# Patient Record
Sex: Female | Born: 1975 | Race: White | Hispanic: No | Smoking: Current every day smoker
Health system: Southern US, Community
[De-identification: ages and names within clinical notes are randomized; demographics above are authoritative.]

## PROBLEM LIST (undated history)

## (undated) ENCOUNTER — Emergency Department (HOSPITAL_COMMUNITY): Payer: Medicaid Other

## (undated) DIAGNOSIS — L988 Other specified disorders of the skin and subcutaneous tissue: Secondary | ICD-10-CM

## (undated) DIAGNOSIS — G8929 Other chronic pain: Secondary | ICD-10-CM

## (undated) DIAGNOSIS — L719 Rosacea, unspecified: Secondary | ICD-10-CM

## (undated) DIAGNOSIS — K5792 Diverticulitis of intestine, part unspecified, without perforation or abscess without bleeding: Secondary | ICD-10-CM

## (undated) DIAGNOSIS — G709 Myoneural disorder, unspecified: Secondary | ICD-10-CM

## (undated) DIAGNOSIS — F988 Other specified behavioral and emotional disorders with onset usually occurring in childhood and adolescence: Secondary | ICD-10-CM

## (undated) DIAGNOSIS — M549 Dorsalgia, unspecified: Secondary | ICD-10-CM

## (undated) DIAGNOSIS — F410 Panic disorder [episodic paroxysmal anxiety] without agoraphobia: Secondary | ICD-10-CM

## (undated) DIAGNOSIS — I10 Essential (primary) hypertension: Secondary | ICD-10-CM

## (undated) HISTORY — PX: COLON SURGERY: SHX602

## (undated) HISTORY — DX: Other specified behavioral and emotional disorders with onset usually occurring in childhood and adolescence: F98.8

## (undated) HISTORY — PX: WISDOM TOOTH EXTRACTION: SHX21

## (undated) HISTORY — DX: Rosacea, unspecified: L71.9

---

## 1999-08-29 ENCOUNTER — Other Ambulatory Visit: Admission: RE | Admit: 1999-08-29 | Discharge: 1999-08-29 | Payer: Self-pay | Admitting: Gynecology

## 2000-02-20 ENCOUNTER — Encounter: Payer: Self-pay | Admitting: Obstetrics and Gynecology

## 2000-02-20 ENCOUNTER — Ambulatory Visit (HOSPITAL_COMMUNITY): Admission: RE | Admit: 2000-02-20 | Discharge: 2000-02-20 | Payer: Self-pay | Admitting: Obstetrics and Gynecology

## 2000-05-10 ENCOUNTER — Ambulatory Visit (HOSPITAL_COMMUNITY): Admission: RE | Admit: 2000-05-10 | Discharge: 2000-05-10 | Payer: Self-pay | Admitting: Obstetrics and Gynecology

## 2000-07-20 ENCOUNTER — Inpatient Hospital Stay (HOSPITAL_COMMUNITY): Admission: AD | Admit: 2000-07-20 | Discharge: 2000-07-22 | Payer: Self-pay | Admitting: Obstetrics and Gynecology

## 2000-07-23 ENCOUNTER — Encounter: Admission: RE | Admit: 2000-07-23 | Discharge: 2000-08-25 | Payer: Self-pay | Admitting: Obstetrics and Gynecology

## 2000-08-30 ENCOUNTER — Other Ambulatory Visit: Admission: RE | Admit: 2000-08-30 | Discharge: 2000-08-30 | Payer: Self-pay | Admitting: Obstetrics and Gynecology

## 2001-09-08 ENCOUNTER — Other Ambulatory Visit: Admission: RE | Admit: 2001-09-08 | Discharge: 2001-09-08 | Payer: Self-pay | Admitting: Obstetrics and Gynecology

## 2002-10-23 ENCOUNTER — Other Ambulatory Visit: Admission: RE | Admit: 2002-10-23 | Discharge: 2002-10-23 | Payer: Self-pay | Admitting: Obstetrics and Gynecology

## 2003-03-13 ENCOUNTER — Encounter: Payer: Self-pay | Admitting: Obstetrics and Gynecology

## 2003-03-13 ENCOUNTER — Ambulatory Visit (HOSPITAL_COMMUNITY): Admission: RE | Admit: 2003-03-13 | Discharge: 2003-03-13 | Payer: Self-pay | Admitting: Obstetrics and Gynecology

## 2003-07-16 ENCOUNTER — Inpatient Hospital Stay (HOSPITAL_COMMUNITY): Admission: AD | Admit: 2003-07-16 | Discharge: 2003-07-16 | Payer: Self-pay | Admitting: Obstetrics and Gynecology

## 2003-07-17 ENCOUNTER — Inpatient Hospital Stay (HOSPITAL_COMMUNITY): Admission: AD | Admit: 2003-07-17 | Discharge: 2003-07-19 | Payer: Self-pay | Admitting: Obstetrics and Gynecology

## 2003-07-20 ENCOUNTER — Encounter: Admission: RE | Admit: 2003-07-20 | Discharge: 2003-08-19 | Payer: Self-pay | Admitting: Obstetrics and Gynecology

## 2003-08-28 ENCOUNTER — Other Ambulatory Visit: Admission: RE | Admit: 2003-08-28 | Discharge: 2003-08-28 | Payer: Self-pay | Admitting: Obstetrics and Gynecology

## 2004-10-02 ENCOUNTER — Other Ambulatory Visit: Admission: RE | Admit: 2004-10-02 | Discharge: 2004-10-02 | Payer: Self-pay | Admitting: Obstetrics and Gynecology

## 2005-10-08 ENCOUNTER — Other Ambulatory Visit: Admission: RE | Admit: 2005-10-08 | Discharge: 2005-10-08 | Payer: Self-pay | Admitting: Obstetrics and Gynecology

## 2010-08-06 ENCOUNTER — Emergency Department (HOSPITAL_BASED_OUTPATIENT_CLINIC_OR_DEPARTMENT_OTHER)
Admission: EM | Admit: 2010-08-06 | Discharge: 2010-08-06 | Disposition: A | Discharge status: Home / Self Care | Payer: BC Managed Care – PPO | Attending: Emergency Medicine | Admitting: Emergency Medicine

## 2010-08-06 DIAGNOSIS — G8929 Other chronic pain: Secondary | ICD-10-CM | POA: Insufficient documentation

## 2010-08-06 DIAGNOSIS — M549 Dorsalgia, unspecified: Secondary | ICD-10-CM | POA: Insufficient documentation

## 2010-08-06 DIAGNOSIS — I10 Essential (primary) hypertension: Secondary | ICD-10-CM | POA: Insufficient documentation

## 2010-11-14 NOTE — Discharge Summary (Signed)
[facility]  Patient:    Brandy Vaughn, Brandy Vaughn                MRN: [id number] Adm. Date:  [id number] Disc. Date: 07/22/00 Attending:  Oliver Pila                           Discharge Summary                                This is a 35 year old white female para 0, gravida 1, EDC July 24, 2000 by last period compatible with a first trimester ultrasound.  Presented to labor and delivery with contractions every seven to 10 minutes.  No leakage of fluid.  No vaginal bleeding.  Good fetal movement.  Patient had been dilated in the office on July 22, 2000 to 4 cm. In triage she was 5-6 cm.  Pregnancy was uncomplicated.  PRENATAL LABORATORIES:        B+ with a negative antibody.  RPR nonreactive. Rubella equivocal.  Hepatitis B surface antigen negative.  HIV negative.  GC and chlamydia negative.  Group B strep negative.  One hour glucola 143.  Three hour GTT:  Fasting 92, one hour 161, two hour 141, three hour 79.  PAST OBSTETRICAL HISTORY:     Negative.  PAST GYNECOLOGICAL HISTORY:   Negative.  PAST SURGICAL HISTORY:        Negative.  PAST MEDICAL HISTORY:         Negative.  ALLERGIES:                    No known drug allergies.  PHYSICAL EXAMINATION:  VITAL SIGNS:                  Afebrile with normal vital signs.  HEART:                        Normal.  LUNGS:                        Normal.  ABDOMEN:                      Soft.  Estimated fetal weight 8.5 pounds.  PELVIC:                       Cervix 5-6 cm, 90%.                                Dr. Senaida Ores performed artificial rupture of the membranes.  Patient had an epidural.  She progressed to full dilatation. She showed a double silver dollar of caput and wanted to proceed with forceps delivery.  Delivery was done by low forceps LOA over a midline episiotomy and third degree extension by Dr. Ambrose Mantle.  Infant was female 7 pounds 9 ounces, Apgars 9 at one and 9 at five  minutes.  Placenta intact.  Uterus normal. Rectal negative.  Midline episiotomy and third degree extension repaired with 3-0 Dexon on the episiotomy and 2-0 Vicryl on the sphincter.  Blood loss about 400 cc.  Postpartum the patient did well and was discharged on the second postpartum day.  A rubella titer was requested after delivery.  It has not  been done.  It will be prior to discharge for the patients future reference. No rubella vaccine is available in the hospital now.  LABORATORIES:                 Hemoglobin 13.2, hematocrit 37.7, white count 12,500, platelet count 242,000.  Followup hemoglobin 11.8, hematocrit 34.0, white count 12,300, platelet count 206,000.  RPR was nonreactive.  FINAL DIAGNOSES:              Intrauterine pregnancy at 39+ weeks, delivered left occiput anterior.  OPERATION:                    Low forceps delivery left occiput anterior, midline episiotomy, third degree extension and repair.  CONDITION ON DISCHARGE:       Improved.  INSTRUCTIONS:                 Regular discharge instruction booklet.  DISCHARGE MEDICATIONS:        Percocet 5/325 16 tablets one q.4-6h. as needed for pain.  FOLLOW-UP:                    Is advised to return to the office in six weeks for followup examination. DD:  07/22/00 TD:  07/22/00 Job: [id number]

## 2010-11-14 NOTE — Discharge Summary (Signed)
NAME:  Brandy Vaughn, Brandy Vaughn                          ACCOUNT NO.:  [id number]   MEDICAL RECORD NO.:  [id number]                   PATIENT TYPE:  INP   LOCATION:  9134                                 FACILITY:  [facility]   PHYSICIAN:  Zenaida Niece, M.D.             DATE OF BIRTH:  04-09-1976   DATE OF ADMISSION:  07/17/2003  DATE OF DISCHARGE:  07/19/2003                                 DISCHARGE SUMMARY   ADMISSION DIAGNOSES:  1. Intrauterine pregnancy at 36 weeks.  2. Preterm labor.   DISCHARGE DIAGNOSES:  1. Intrauterine pregnancy at 36 weeks.  2. Preterm labor.   PROCEDURES:  On July 17, 2003 she had a spontaneous vaginal delivery.   HISTORY AND PHYSICAL:  This is a 35 year old __________ female gravida 2  para 1-0-0-1 with an EGA of 36-plus weeks by a 10-week ultrasound who  presented with a complaint of regular contractions without bleeding or  rupture of membranes and with good fetal movement.  On evaluation in triage  over a couple hours she changed from 3-6 cm and was admitted.  Prenatal care  essentially uncomplicated.   PRENATAL LABORATORIES:  Blood type B positive with a negative antibody  screen, RPR nonreactive, rubella equivocal, hepatitis B surface antigen  negative, HIV negative, gonorrhea and Chlamydia negative, Pap smear was  normal, triple screen normal, 1-hour glucola 116 and group B streptococcus  was negative.   PAST OBSTETRICAL HISTORY:  January 2002 forceps delivery at 39 weeks, 7  pounds 9 ounces, complicated by PIH.  Remainder of her history is  noncontributory.   PHYSICAL EXAMINATION:  She was afebrile with stable vital signs.  Fetal  heart tracing reactive with contractions every 2-5 minutes.  Abdomen gravid,  nontender.  Vaginal exam on my first exam she was complete, complete, +3 and  no membranes were noted.   HOSPITAL COURSE:  Patient was admitted and brought to labor and delivery.  Upon arrival to labor and delivery she was completely dilated.   She then  pushed well and had a vaginal delivery of a viable female infant with Apgars  of 8 and 9 that weighed 5 pounds 5 ounces.  A loose nuchal cord x1 was  reduced.  Placenta delivered spontaneously and was intact.  She had first  degree perineal and periurethral lacerations which were hemostatic and not  repaired and estimated blood loss was less than 500 mL.  Postpartum she had  no complications.  Pre-delivery hemoglobin 13.3, post delivery 12.5.  On  postpartum day #2 she was stable for discharge home.   DISCHARGE INSTRUCTIONS:  Regular diet.  Pelvic rest.  Follow up in 6 weeks.  Medications are Percocet (#20) one p.o. q.6h. p.r.n. and over-the-counter  ibuprofen as needed.  She is given our discharge pamphlet.  Zenaida Niece, M.D.   TDM/MEDQ  D:  07/19/2003  T:  07/20/2003  Job:  [id number]

## 2010-12-14 ENCOUNTER — Inpatient Hospital Stay (HOSPITAL_COMMUNITY)
Admission: AD | Admit: 2010-12-14 | Discharge: 2010-12-15 | Disposition: A | Discharge status: Home / Self Care | Payer: BC Managed Care – PPO | Source: Ambulatory Visit | Attending: Obstetrics and Gynecology | Admitting: Obstetrics and Gynecology

## 2010-12-14 DIAGNOSIS — N949 Unspecified condition associated with female genital organs and menstrual cycle: Secondary | ICD-10-CM

## 2010-12-14 LAB — DIFFERENTIAL
Basophils Absolute: 0 10*3/uL (ref 0.0–0.1)
Basophils Relative: 0 % (ref 0–1)
Eosinophils Absolute: 0.1 10*3/uL (ref 0.0–0.7)
Eosinophils Relative: 1 % (ref 0–5)
Lymphocytes Relative: 23 % (ref 12–46)
Lymphs Abs: 2.4 10*3/uL (ref 0.7–4.0)
Monocytes Absolute: 0.8 10*3/uL (ref 0.1–1.0)
Monocytes Relative: 7 % (ref 3–12)
Neutro Abs: 7.4 10*3/uL (ref 1.7–7.7)
Neutrophils Relative %: 69 % (ref 43–77)

## 2010-12-14 LAB — URINALYSIS, ROUTINE W REFLEX MICROSCOPIC
Bilirubin Urine: NEGATIVE
Glucose, UA: NEGATIVE mg/dL
Ketones, ur: NEGATIVE mg/dL
Leukocytes, UA: NEGATIVE
Nitrite: NEGATIVE
Protein, ur: NEGATIVE mg/dL
Specific Gravity, Urine: <1.005 — ABNORMAL LOW (ref 1.005–1.030)
Urobilinogen, UA: 0.2 mg/dL (ref 0.0–1.0)
pH: 5 (ref 5.0–8.0)

## 2010-12-14 LAB — CBC
HCT: 36.2 % (ref 36.0–46.0)
MCHC: 32.9 g/dL (ref 30.0–36.0)
RDW: 13 % (ref 11.5–15.5)

## 2010-12-14 LAB — URINE MICROSCOPIC-ADD ON

## 2010-12-15 ENCOUNTER — Inpatient Hospital Stay (HOSPITAL_COMMUNITY): Payer: BC Managed Care – PPO

## 2010-12-15 LAB — WET PREP, GENITAL: Trich, Wet Prep: NONE SEEN

## 2010-12-16 LAB — GC/CHLAMYDIA PROBE AMP, GENITAL: Chlamydia, DNA Probe: NEGATIVE

## 2013-02-26 ENCOUNTER — Emergency Department (HOSPITAL_BASED_OUTPATIENT_CLINIC_OR_DEPARTMENT_OTHER)
Admission: EM | Admit: 2013-02-26 | Discharge: 2013-02-27 | Disposition: A | Discharge status: Home / Self Care | Payer: BC Managed Care – PPO | Attending: Emergency Medicine | Admitting: Emergency Medicine

## 2013-02-26 ENCOUNTER — Encounter (HOSPITAL_BASED_OUTPATIENT_CLINIC_OR_DEPARTMENT_OTHER): Payer: Self-pay | Admitting: *Deleted

## 2013-02-26 DIAGNOSIS — N898 Other specified noninflammatory disorders of vagina: Secondary | ICD-10-CM | POA: Insufficient documentation

## 2013-02-26 DIAGNOSIS — N72 Inflammatory disease of cervix uteri: Secondary | ICD-10-CM

## 2013-02-26 DIAGNOSIS — F17200 Nicotine dependence, unspecified, uncomplicated: Secondary | ICD-10-CM | POA: Insufficient documentation

## 2013-02-26 DIAGNOSIS — Z3202 Encounter for pregnancy test, result negative: Secondary | ICD-10-CM | POA: Insufficient documentation

## 2013-02-26 DIAGNOSIS — I10 Essential (primary) hypertension: Secondary | ICD-10-CM | POA: Insufficient documentation

## 2013-02-26 HISTORY — DX: Essential (primary) hypertension: I10

## 2013-02-26 NOTE — ED Notes (Signed)
Pt c/o left lower abd pain that started one week ago. States she has a hx of an IUD for past 2 years. Describes pain tender to touch to left lower abdomen. States she has had some yellow/brown discharge. Denies fevers. States a little urinary frequency at night.

## 2013-02-27 LAB — WET PREP, GENITAL
Trich, Wet Prep: NONE SEEN
Yeast Wet Prep HPF POC: NONE SEEN

## 2013-02-27 LAB — URINALYSIS, ROUTINE W REFLEX MICROSCOPIC
Bilirubin Urine: NEGATIVE
Nitrite: NEGATIVE
Specific Gravity, Urine: 1.013 (ref 1.005–1.030)
pH: 6 (ref 5.0–8.0)

## 2013-02-27 LAB — URINE MICROSCOPIC-ADD ON

## 2013-02-27 MED ORDER — CEFTRIAXONE SODIUM 250 MG IJ SOLR
250.0000 mg | Freq: Once | INTRAMUSCULAR | Status: AC
  Administered 2013-02-27: 250 mg via INTRAMUSCULAR
  Filled 2013-02-27: qty 250

## 2013-02-27 MED ORDER — HYDROCODONE-ACETAMINOPHEN 5-325 MG PO TABS: 1.0000 | ORAL_TABLET | Freq: Four times a day (QID) | ORAL | Status: DC | PRN

## 2013-02-27 MED ORDER — KETOROLAC TROMETHAMINE 60 MG/2ML IM SOLN
60.0000 mg | Freq: Once | INTRAMUSCULAR | Status: AC
  Administered 2013-02-27: 60 mg via INTRAMUSCULAR
  Filled 2013-02-27: qty 2

## 2013-02-27 MED ORDER — AZITHROMYCIN 1 G PO PACK
1.0000 g | PACK | Freq: Once | ORAL | Status: AC
  Administered 2013-02-27: 1 g via ORAL
  Filled 2013-02-27: qty 1

## 2013-02-27 MED ORDER — LIDOCAINE HCL (PF) 1 % IJ SOLN
INTRAMUSCULAR | Status: AC
  Filled 2013-02-27: qty 5

## 2013-02-27 MED ORDER — DOXYCYCLINE HYCLATE 100 MG PO CAPS: 100.0000 mg | ORAL_CAPSULE | Freq: Two times a day (BID) | ORAL | Status: DC

## 2013-02-27 NOTE — ED Notes (Signed)
MD at bedside. 

## 2013-02-27 NOTE — ED Provider Notes (Signed)
CSN: [id number]     Arrival date & time 02/26/13  2311 History  This chart was scribed for Brandy Smitty Cords, MD by Leone Payor, ED Scribe. This patient was seen in room MH01/MH01 and the patient's care was started 12:09 AM.    Chief Complaint  Patient presents with  . Abdominal Pain    Patient is a 37 y.o. female presenting with abdominal pain. The history is provided by the patient. No language interpreter was used.  Abdominal Pain Pain location:  Suprapubic Pain quality: aching   Pain radiates to:  Does not radiate Pain severity:  Moderate Onset quality:  Gradual Timing:  Constant Progression:  Unchanged Chronicity:  New Context: not alcohol use and not sick contacts   Relieved by:  Nothing Worsened by:  Nothing tried Ineffective treatments:  None tried Associated symptoms: vaginal discharge   Associated symptoms: no diarrhea   Vaginal discharge:    Quality:  Mucopurulent and yellow   Severity:  Severe   Onset quality:  Gradual   Timing:  Constant   Progression:  Worsening   Chronicity:  New Risk factors: no recent hospitalization     HPI Comments: Brandy Vaughn is a 37 y.o. female who presents to the Emergency Department complaining of constant, gradually worsening suprapubic pain that started about 1.5  weeks ago. States she began to have white discharge that began to turn yellow/brown and reports it to heavy. She has history of an IUD for the past 2 years, but she is unable to feel the strings. She is sexually active but with the same partner for over 1 year. She has had unprotected intercourse with this partner but he denies any sort of discharge. Last pap smear was over 1 year ago and normal. She denies dysuria, external genital lesions.    Past Medical History  Diagnosis Date  . Hypertension    History reviewed. No pertinent past surgical history. No family history on file. History  Substance Use Topics  . Smoking status: Current Every Day Smoker  .  Smokeless tobacco: Not on file  . Alcohol Use: Yes     Comment: occasional    OB History   Grav Para Term Preterm Abortions TAB SAB Ect Mult Living                 Review of Systems  Gastrointestinal: Positive for abdominal pain. Negative for diarrhea.  Genitourinary: Positive for vaginal discharge.  All other systems reviewed and are negative.    Allergies  Review of patient's allergies indicates no known allergies.  Home Medications   Current Outpatient Rx  Name  Route  Sig  Dispense  Refill  . Valsartan (DIOVAN PO)   Oral   Take by mouth.          BP 139/84  Pulse 91  Temp(Src) 98.6 F (37 C) (Oral)  Resp 20  SpO2 99% Physical Exam  Nursing note and vitals reviewed. Constitutional: She is oriented to person, place, and time. She appears well-developed and well-nourished. No distress.  HENT:  Head: Normocephalic and atraumatic.  Mouth/Throat: Oropharynx is clear and moist.  Eyes: Pupils are equal, round, and reactive to light.  Neck: Normal range of motion. Neck supple.  Cardiovascular: Normal rate, regular rhythm and normal heart sounds.   Pulmonary/Chest: Effort normal and breath sounds normal. No respiratory distress. She has no wheezes.  Abdominal: Soft. Bowel sounds are normal. There is tenderness (suprapubic). There is no rebound.  Genitourinary: Vaginal discharge found.  Chaperone present copious yellow mucopurulent discharge no seen strings for IUD  Musculoskeletal: Normal range of motion.  Neurological: She is alert and oriented to person, place, and time. She has normal reflexes.  Skin: Skin is warm and dry.  Psychiatric: She has a normal mood and affect.    ED Course  Procedures (including critical care time)  DIAGNOSTIC STUDIES: Oxygen Saturation is 99% on RA, normal by my interpretation.    COORDINATION OF CARE: 12:28 AM Discussed treatment plan with pt at bedside and pt agreed to plan.   Labs Review Labs Reviewed  URINALYSIS, ROUTINE W  REFLEX MICROSCOPIC  PREGNANCY, URINE   Imaging Review No results found.  MDM  No diagnosis found. Will treat for gc/chlamydia.  IUD must be removed by GYN as no strings visible.  Partner must be treated.  No sexual activity for 7 days after all partners treated.  Barrier protection for 1 month while taking antibiotics to prevent pregnancy.  Patient verbalizes understanding and agrees to follow up   I personally performed the services described in this documentation, which was scribed in my presence. The recorded information has been reviewed and is accurate.    Jasmine Awe, MD 02/27/13 [phone]

## 2013-02-28 ENCOUNTER — Ambulatory Visit (HOSPITAL_COMMUNITY)
Admission: RE | Admit: 2013-02-28 | Discharge: 2013-02-28 | Disposition: A | Discharge status: Home / Self Care | Payer: BC Managed Care – PPO | Source: Ambulatory Visit | Attending: Obstetrics and Gynecology | Admitting: Obstetrics and Gynecology

## 2013-02-28 ENCOUNTER — Other Ambulatory Visit (HOSPITAL_COMMUNITY): Payer: Self-pay | Admitting: Obstetrics and Gynecology

## 2013-02-28 DIAGNOSIS — Z30432 Encounter for removal of intrauterine contraceptive device: Secondary | ICD-10-CM

## 2013-02-28 DIAGNOSIS — Z30431 Encounter for routine checking of intrauterine contraceptive device: Secondary | ICD-10-CM | POA: Insufficient documentation

## 2013-02-28 DIAGNOSIS — N83209 Unspecified ovarian cyst, unspecified side: Secondary | ICD-10-CM | POA: Insufficient documentation

## 2013-02-28 LAB — URINE CULTURE

## 2013-03-27 ENCOUNTER — Other Ambulatory Visit: Payer: Self-pay | Admitting: Gastroenterology

## 2013-03-27 DIAGNOSIS — R109 Unspecified abdominal pain: Secondary | ICD-10-CM

## 2013-03-27 DIAGNOSIS — K639 Disease of intestine, unspecified: Secondary | ICD-10-CM

## 2013-03-29 ENCOUNTER — Other Ambulatory Visit: Payer: BC Managed Care – PPO

## 2013-04-03 ENCOUNTER — Ambulatory Visit
Admission: RE | Admit: 2013-04-03 | Discharge: 2013-04-03 | Disposition: A | Discharge status: Home / Self Care | Payer: BC Managed Care – PPO | Source: Ambulatory Visit | Attending: Gastroenterology | Admitting: Gastroenterology

## 2013-04-03 DIAGNOSIS — K639 Disease of intestine, unspecified: Secondary | ICD-10-CM

## 2013-04-03 DIAGNOSIS — R109 Unspecified abdominal pain: Secondary | ICD-10-CM

## 2013-04-03 MED ORDER — IOHEXOL 300 MG/ML  SOLN
100.0000 mL | Freq: Once | INTRAMUSCULAR | Status: AC | PRN
  Administered 2013-04-03: 100 mL via INTRAVENOUS

## 2013-07-11 ENCOUNTER — Other Ambulatory Visit: Payer: Self-pay | Admitting: *Deleted

## 2013-07-11 ENCOUNTER — Encounter (INDEPENDENT_AMBULATORY_CARE_PROVIDER_SITE_OTHER): Payer: Self-pay

## 2013-07-11 ENCOUNTER — Ambulatory Visit (INDEPENDENT_AMBULATORY_CARE_PROVIDER_SITE_OTHER): Payer: Medicaid Other | Admitting: Family Medicine

## 2013-07-11 ENCOUNTER — Encounter: Payer: Self-pay | Admitting: Family Medicine

## 2013-07-11 VITALS — BP 152/94 | HR 78 | Temp 98.7°F | Resp 16 | Ht 67.0 in | Wt 163.0 lb

## 2013-07-11 DIAGNOSIS — R05 Cough: Secondary | ICD-10-CM

## 2013-07-11 DIAGNOSIS — J111 Influenza due to unidentified influenza virus with other respiratory manifestations: Secondary | ICD-10-CM

## 2013-07-11 DIAGNOSIS — R059 Cough, unspecified: Secondary | ICD-10-CM

## 2013-07-11 DIAGNOSIS — J069 Acute upper respiratory infection, unspecified: Secondary | ICD-10-CM

## 2013-07-11 LAB — POCT INFLUENZA A/B
Influenza A, POC: NEGATIVE
Influenza B, POC: NEGATIVE

## 2013-07-11 MED ORDER — AZITHROMYCIN 500 MG PO TABS: 500.0000 mg | ORAL_TABLET | Freq: Every day | ORAL | Status: DC

## 2013-07-11 MED ORDER — AZITHROMYCIN 500 MG PO TABS: 500.0000 mg | ORAL_TABLET | Freq: Every day | ORAL | Status: AC

## 2013-07-11 MED ORDER — HYDROCODONE-HOMATROPINE 5-1.5 MG/5ML PO SYRP: 5.0000 mL | ORAL_SOLUTION | Freq: Four times a day (QID) | ORAL | Status: DC | PRN

## 2013-07-11 MED ORDER — BENZONATATE 200 MG PO CAPS: 200.0000 mg | ORAL_CAPSULE | Freq: Three times a day (TID) | ORAL | Status: DC | PRN

## 2013-07-11 MED ORDER — OSELTAMIVIR PHOSPHATE 75 MG PO CAPS: 75.0000 mg | ORAL_CAPSULE | Freq: Two times a day (BID) | ORAL | Status: DC

## 2013-07-11 MED ORDER — OSELTAMIVIR PHOSPHATE 75 MG PO CAPS
75.0000 mg | ORAL_CAPSULE | Freq: Two times a day (BID) | ORAL | Status: DC
Start: 2013-07-11 — End: 2013-07-11

## 2013-07-11 NOTE — Progress Notes (Signed)
Subjective:    Patient ID: Brandy Vaughn, female    DOB: 01/10/1976, 38 y.o.   MRN: [id number]   [name] is here complaining of URI symptoms.  She feels that she may have the flu.  She had dinner yesterday with a friend Brandy Vaughn(Cheryl Mabry) who was diagnosed with Influenza.   URI  The current episode started in the past 7 days. Associated symptoms include chest pain, coughing and rhinorrhea. Treatments tried: Nyquil, Delsym, Tylenol, Ibuprofen and Aleve  The treatment provided no relief.     Review of Systems  Constitutional: Positive for fatigue.  HENT: Positive for rhinorrhea.   Respiratory: Positive for cough.   Cardiovascular: Positive for chest pain.       With cough   Musculoskeletal: Positive for arthralgias and myalgias.    Past Medical History  Diagnosis Date  . Hypertension   . ADD (attention deficit disorder)   . Rosacea      Past Surgical History  Procedure Laterality Date  . Wisdom tooth extraction       History   Social History Narrative   Marital Status:  Separated (Richard)    Children:  Daughters (2)    Pets: Dog (Zoey)    Living Situation: Lives with children.    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    Alcohol Use:  Occasional   Drug Use:  None   Diet:  Regular   Exercise:  Limited    Hobbies:  Reading      Family History  Problem Relation Age of Onset  . Cancer Mother     Colon, Liver and Spine  . Hypertension Father   . Osteoporosis Paternal Grandmother   . Cancer Paternal Grandfather     Bladder Cancer     Current Outpatient Prescriptions on File Prior to Visit  Medication Sig Dispense Refill  . HYDROcodone-acetaminophen (NORCO) 5-325 MG per tablet Take 1 tablet by mouth every 6 (six) hours as needed for pain.  6 tablet  0   No current facility-administered medications on file prior to visit.     No Known Allergies      Objective:   Physical Exam  Nursing  note and vitals reviewed. Constitutional: She appears well-nourished. She is cooperative. She appears ill. No distress.  HENT:  Head: Normocephalic.  Mouth/Throat: No oropharyngeal exudate.  Eyes: Conjunctivae are normal. Right eye exhibits no discharge. Left eye exhibits no discharge.  Neck: Neck supple.  Cardiovascular: Normal rate, regular rhythm and normal heart sounds.  Exam reveals no gallop and no friction rub.   No murmur heard. Pulmonary/Chest: Effort normal and breath sounds normal. She has no wheezes. She exhibits no tenderness.  Lymphadenopathy:    She has no cervical adenopathy.  Neurological: She is alert.  Skin: Skin is warm and dry. No rash noted.  Psychiatric: She has a normal mood and affect.       Assessment & Plan:    Rissa was seen today for flu like symptoms.  Diagnoses and associated orders for this visit:  Influenza with other respiratory manifestations - oseltamivir (TAMIFLU) 75 MG capsule; Take 1 capsule (75 mg total) by mouth 2 (two) times daily.  Acute upper respiratory infections of unspecified site - POCT Influenza A/B  Cough - azithromycin (ZITHROMAX) 500 MG tablet; Take 1 tablet (500 mg total) by mouth daily. Take 1 tablet daily for 3 days. - benzonatate (TESSALON) 200 MG  capsule; Take 1 capsule (200 mg total) by mouth 3 (three) times daily as needed for cough. - HYDROcodone-homatropine (HYCODAN) 5-1.5 MG/5ML syrup; Take 5 mLs by mouth every 6 (six) hours as needed for cough.

## 2013-07-11 NOTE — Patient Instructions (Signed)
1)  Congestion/Cough - Mucinex DM 1200/60 twice a day plus Tessalon Perles 3 x per day plus Hyocodan 1 tsp 3 x per day  2)  ? Flu/Pneumonia Prevention - Tamiflu plus the Zithromax.    Influenza, Adult Influenza ("the flu") is a viral infection of the respiratory tract. It occurs more often in winter months because people spend more time in close contact with one another. Influenza can make you feel very sick. Influenza easily spreads from person to person (contagious). CAUSES  Influenza is caused by a virus that infects the respiratory tract. You can catch the virus by breathing in droplets from an infected person's cough or sneeze. You can also catch the virus by touching something that was recently contaminated with the virus and then touching your mouth, nose, or eyes. SYMPTOMS  Symptoms typically last 4 to 10 days and may include:  Fever.  Chills.  Headache, body aches, and muscle aches.  Sore throat.  Chest discomfort and cough.  Poor appetite.  Weakness or feeling tired.  Dizziness.  Nausea or vomiting. DIAGNOSIS  Diagnosis of influenza is often made based on your history and a physical exam. A nose or throat swab test can be done to confirm the diagnosis. RISKS AND COMPLICATIONS You may be at risk for a more severe case of influenza if you smoke cigarettes, have diabetes, have chronic heart disease (such as heart failure) or lung disease (such as asthma), or if you have a weakened immune system. Elderly people and pregnant women are also at risk for more serious infections. The most common complication of influenza is a lung infection (pneumonia). Sometimes, this complication can require emergency medical care and may be life-threatening. PREVENTION  An annual influenza vaccination (flu shot) is the best way to avoid getting influenza. An annual flu shot is now routinely recommended for all adults in the [place] TREATMENT  In mild cases, influenza goes away on its own.  Treatment is directed at relieving symptoms. For more severe cases, your caregiver may prescribe antiviral medicines to shorten the sickness. Antibiotic medicines are not effective, because the infection is caused by a virus, not by bacteria. HOME CARE INSTRUCTIONS  Only take over-the-counter or prescription medicines for pain, discomfort, or fever as directed by your caregiver.  Use a cool mist humidifier to make breathing easier.  Get plenty of rest until your temperature returns to normal. This usually takes 3 to 4 days.  Drink enough fluids to keep your urine clear or pale yellow.  Cover your mouth and nose when coughing or sneezing, and wash your hands well to avoid spreading the virus.  Stay home from work or school until your fever has been gone for at least 1 full day. SEEK MEDICAL CARE IF:   You have chest pain or a deep cough that worsens or produces more mucus.  You have nausea, vomiting, or diarrhea. SEEK IMMEDIATE MEDICAL CARE IF:   You have difficulty breathing, shortness of breath, or your skin or nails turn bluish.  You have severe neck pain or stiffness.  You have a severe headache, facial pain, or earache.  You have a worsening or recurring fever.  You have nausea or vomiting that cannot be controlled. MAKE SURE YOU:  Understand these instructions.  Will watch your condition.  Will get help right away if you are not doing well or get worse. Document Released: 06/12/2000 Document Revised: 12/15/2011 Document Reviewed: 09/14/2011 [facility] Patient Information 2014 [employer] [place].

## 2013-11-14 ENCOUNTER — Ambulatory Visit: Payer: Medicaid Other | Admitting: Family Medicine

## 2013-11-27 ENCOUNTER — Encounter: Payer: Self-pay | Admitting: Family Medicine

## 2013-11-27 ENCOUNTER — Ambulatory Visit (INDEPENDENT_AMBULATORY_CARE_PROVIDER_SITE_OTHER): Payer: Medicaid Other | Admitting: Family Medicine

## 2013-11-27 VITALS — BP 150/95 | HR 81 | Resp 16 | Ht 67.5 in | Wt 161.0 lb

## 2013-11-27 DIAGNOSIS — F988 Other specified behavioral and emotional disorders with onset usually occurring in childhood and adolescence: Secondary | ICD-10-CM

## 2013-11-27 DIAGNOSIS — I10 Essential (primary) hypertension: Secondary | ICD-10-CM

## 2013-11-27 DIAGNOSIS — R070 Pain in throat: Secondary | ICD-10-CM

## 2013-11-27 MED ORDER — LISDEXAMFETAMINE DIMESYLATE 40 MG PO CAPS: 40.0000 mg | ORAL_CAPSULE | ORAL | Status: DC

## 2013-11-27 MED ORDER — VALSARTAN-HYDROCHLOROTHIAZIDE 160-25 MG PO TABS: 1.0000 | ORAL_TABLET | Freq: Every day | ORAL | Status: DC

## 2013-11-27 NOTE — Progress Notes (Signed)
Subjective:    Patient ID: Brandy Vaughn, female    DOB: 08/21/1975, 38 y.o.   MRN: [id number]  HPI  Brandy Vaughn is here today to get a refill on her medications and to follow up on the following issues:  1)  ADD:  She is wanting to get back on Vyvanse.  She has taken it in the past and feels that it worked very well for her.    2)  Hypertension:  She has not been taking her blood pressure medication. Her blood pressure is elevated today.  3)  Swollen Lymph Node:  She has a swollen lymph on the left side of her neck she would like checked. Her chiropractor noticed it last week. She has noticed that her throat has been sore and she feels that she has "foul" smell coming from her throat.  She has some white spots in her throat which she feels are "tonsil stones".  She has had them in the past.    Review of Systems  Constitutional: Negative for activity change, appetite change and fatigue.  HENT: Positive for sore throat.        Swelled lymph node on the left side of neck  Cardiovascular: Negative for chest pain, palpitations and leg swelling.       Her blood pressure is elevated  Psychiatric/Behavioral: Positive for decreased concentration. The patient is not nervous/anxious.   All other systems reviewed and are negative.    Past Medical History  Diagnosis Date  . Hypertension   . ADD (attention deficit disorder)   . Rosacea   . Vaginal tear resulting from childbirth     3rd degree  . Chronic back pain     on chronic narcotics     Past Surgical History  Procedure Laterality Date  . Wisdom tooth extraction       History   Social History Narrative   Marital Status:  Separated (Brandy Vaughn)    Children:  Daughters (2)    Pets: Dog (Zoey)    Living Situation: Lives with children.    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    Alcohol Use:  Occasional   Drug Use:  None   Diet:  Regular   Exercise:  Limited    Hobbies:  Reading      Family History  Problem Relation Age of Onset  . Cancer Mother     Colon, Cervical   . Hypertension Father   . Osteoporosis Paternal Grandmother   . Hypertension Paternal Grandmother   . Cancer Paternal Grandfather     Bladder, Throat      No Known Allergies      Objective:   Physical Exam  Nursing note and vitals reviewed. Constitutional: She is oriented to person, place, and time. She appears well-nourished.  HENT:  Head: Normocephalic.  Mouth/Throat: Oropharynx is clear and moist. No oropharyngeal exudate.  Eyes: Conjunctivae are normal. No scleral icterus.  Neck: Normal range of motion. Neck supple.  Cardiovascular: Normal rate and regular rhythm.   Pulmonary/Chest: Effort normal and breath sounds normal.  Musculoskeletal: Normal range of motion.  Lymphadenopathy:    She has no cervical adenopathy.  Neurological: She is oriented to person, place, and time.  Skin: Skin is warm and dry.  Psychiatric: She has a normal mood and affect. Her behavior is normal. Judgment and thought content normal.      Assessment & Plan:  Brandy Vaughn was seen today for medication management.  Diagnoses and associated orders for this visit:  Attention deficit disorder without mention of hyperactivity Comments: She will get back on Vyvanse.   -  lisdexamfetamine (VYVANSE) 40 MG capsule; Take 1 capsule (40 mg total) by mouth every morning.  Throat pain Comments: Her throat looks fine. She is to gargle with antiseptic mouthwash.    Essential hypertension, benign Comments: She will continue on her Diovan HCT.   - valsartan-hydrochlorothiazide (DIOVAN HCT) 160-25 MG per tablet; Take 1 tablet by mouth daily.  TIME SPENT "FACE TO FACE" WITH PATIENT -  30  MINS

## 2013-12-13 ENCOUNTER — Telehealth: Payer: Self-pay

## 2013-12-13 NOTE — Telephone Encounter (Signed)
[facility] [address] [phone], she will come by tomorrow and pick up samples

## 2013-12-23 ENCOUNTER — Emergency Department (HOSPITAL_COMMUNITY)
Admission: EM | Admit: 2013-12-23 | Discharge: 2013-12-24 | Disposition: A | Discharge status: Home / Self Care | Payer: Medicaid Other | Attending: Emergency Medicine | Admitting: Emergency Medicine

## 2013-12-23 ENCOUNTER — Encounter (HOSPITAL_COMMUNITY): Payer: Self-pay | Admitting: Emergency Medicine

## 2013-12-23 DIAGNOSIS — F17200 Nicotine dependence, unspecified, uncomplicated: Secondary | ICD-10-CM | POA: Insufficient documentation

## 2013-12-23 DIAGNOSIS — F988 Other specified behavioral and emotional disorders with onset usually occurring in childhood and adolescence: Secondary | ICD-10-CM | POA: Insufficient documentation

## 2013-12-23 DIAGNOSIS — N824 Other female intestinal-genital tract fistulae: Secondary | ICD-10-CM | POA: Insufficient documentation

## 2013-12-23 DIAGNOSIS — I10 Essential (primary) hypertension: Secondary | ICD-10-CM | POA: Insufficient documentation

## 2013-12-23 DIAGNOSIS — Z3202 Encounter for pregnancy test, result negative: Secondary | ICD-10-CM | POA: Insufficient documentation

## 2013-12-23 DIAGNOSIS — Z872 Personal history of diseases of the skin and subcutaneous tissue: Secondary | ICD-10-CM | POA: Insufficient documentation

## 2013-12-23 DIAGNOSIS — Z79899 Other long term (current) drug therapy: Secondary | ICD-10-CM | POA: Insufficient documentation

## 2013-12-23 DIAGNOSIS — N823 Fistula of vagina to large intestine: Secondary | ICD-10-CM

## 2013-12-23 DIAGNOSIS — N12 Tubulo-interstitial nephritis, not specified as acute or chronic: Secondary | ICD-10-CM | POA: Insufficient documentation

## 2013-12-23 DIAGNOSIS — E876 Hypokalemia: Secondary | ICD-10-CM

## 2013-12-23 LAB — CBC WITH DIFFERENTIAL/PLATELET
BASOS ABS: 0 10*3/uL (ref 0.0–0.1)
Basophils Relative: 0 % (ref 0–1)
Eosinophils Absolute: 0 10*3/uL (ref 0.0–0.7)
Eosinophils Relative: 0 % (ref 0–5)
HCT: 38.1 % (ref 36.0–46.0)
Hemoglobin: 12.6 g/dL (ref 12.0–15.0)
Lymphocytes Relative: 10 % — ABNORMAL LOW (ref 12–46)
Lymphs Abs: 1.7 10*3/uL (ref 0.7–4.0)
MCH: 31.4 pg (ref 26.0–34.0)
MCHC: 33.1 g/dL (ref 30.0–36.0)
MCV: 95 fL (ref 78.0–100.0)
Monocytes Absolute: 1.4 10*3/uL — ABNORMAL HIGH (ref 0.1–1.0)
Monocytes Relative: 8 % (ref 3–12)
NEUTROS ABS: 14.7 10*3/uL — AB (ref 1.7–7.7)
Neutrophils Relative %: 82 % — ABNORMAL HIGH (ref 43–77)
Platelets: 251 10*3/uL (ref 150–400)
RBC: 4.01 MIL/uL (ref 3.87–5.11)
RDW: 12 % (ref 11.5–15.5)
WBC: 17.9 10*3/uL — AB (ref 4.0–10.5)

## 2013-12-23 LAB — POC URINE PREG, ED: Preg Test, Ur: NEGATIVE

## 2013-12-23 LAB — URINALYSIS, ROUTINE W REFLEX MICROSCOPIC
Bilirubin Urine: NEGATIVE
GLUCOSE, UA: NEGATIVE mg/dL
Ketones, ur: NEGATIVE mg/dL
Nitrite: POSITIVE — AB
PH: 6 (ref 5.0–8.0)
Protein, ur: >300 mg/dL — AB
Specific Gravity, Urine: 1.027 (ref 1.005–1.030)
Urobilinogen, UA: 1 mg/dL (ref 0.0–1.0)

## 2013-12-23 LAB — URINE MICROSCOPIC-ADD ON

## 2013-12-23 NOTE — ED Notes (Signed)
Pt c/o mid back pain after attempting to catch a tv, pt also states Friday began with chills,n/v, low abd pain, fever up to 104.1. HA x 2 days.

## 2013-12-24 ENCOUNTER — Emergency Department (HOSPITAL_COMMUNITY): Payer: Medicaid Other

## 2013-12-24 LAB — COMPREHENSIVE METABOLIC PANEL
ALT: 19 U/L (ref 0–35)
AST: 21 U/L (ref 0–37)
Albumin: 3.6 g/dL (ref 3.5–5.2)
Alkaline Phosphatase: 74 U/L (ref 39–117)
BILIRUBIN TOTAL: 0.3 mg/dL (ref 0.3–1.2)
BUN: 7 mg/dL (ref 6–23)
CHLORIDE: 95 meq/L — AB (ref 96–112)
CO2: 24 mEq/L (ref 19–32)
Calcium: 8.8 mg/dL (ref 8.4–10.5)
Creatinine, Ser: 0.67 mg/dL (ref 0.50–1.10)
GFR calc Af Amer: >90 mL/min (ref >90)
GFR calc non Af Amer: >90 mL/min (ref >90)
Glucose, Bld: 172 mg/dL — ABNORMAL HIGH (ref 70–99)
Potassium: 3.4 mEq/L — ABNORMAL LOW (ref 3.7–5.3)
SODIUM: 134 meq/L — AB (ref 137–147)
Total Protein: 7.7 g/dL (ref 6.0–8.3)

## 2013-12-24 LAB — WET PREP, GENITAL
Clue Cells Wet Prep HPF POC: NONE SEEN
TRICH WET PREP: NONE SEEN
Yeast Wet Prep HPF POC: NONE SEEN

## 2013-12-24 LAB — I-STAT CG4 LACTIC ACID, ED: Lactic Acid, Venous: 0.74 mmol/L (ref 0.5–2.2)

## 2013-12-24 LAB — LIPASE, BLOOD: Lipase: 15 U/L (ref 11–59)

## 2013-12-24 MED ORDER — HYDROCODONE-ACETAMINOPHEN 10-325 MG PO TABS: 1.0000 | ORAL_TABLET | Freq: Three times a day (TID) | ORAL | Status: DC

## 2013-12-24 MED ORDER — ONDANSETRON HCL 4 MG PO TABS: 4.0000 mg | ORAL_TABLET | Freq: Four times a day (QID) | ORAL | Status: DC

## 2013-12-24 MED ORDER — ONDANSETRON HCL 4 MG/2ML IJ SOLN
4.0000 mg | INTRAMUSCULAR | Status: AC
  Administered 2013-12-24: 4 mg via INTRAVENOUS
  Filled 2013-12-24: qty 2

## 2013-12-24 MED ORDER — IBUPROFEN 800 MG PO TABS
800.0000 mg | ORAL_TABLET | Freq: Once | ORAL | Status: AC
  Administered 2013-12-24: 800 mg via ORAL
  Filled 2013-12-24: qty 1

## 2013-12-24 MED ORDER — IOHEXOL 300 MG/ML  SOLN
100.0000 mL | Freq: Once | INTRAMUSCULAR | Status: AC | PRN
  Administered 2013-12-24: 100 mL via INTRAVENOUS

## 2013-12-24 MED ORDER — MORPHINE SULFATE 4 MG/ML IJ SOLN
4.0000 mg | Freq: Once | INTRAMUSCULAR | Status: AC
  Administered 2013-12-24: 4 mg via INTRAVENOUS
  Filled 2013-12-24: qty 1

## 2013-12-24 MED ORDER — MORPHINE SULFATE 4 MG/ML IJ SOLN
4.0000 mg | Freq: Once | INTRAMUSCULAR | Status: DC
  Filled 2013-12-24: qty 1

## 2013-12-24 MED ORDER — SODIUM CHLORIDE 0.9 % IV BOLUS (SEPSIS)
1000.0000 mL | Freq: Once | INTRAVENOUS | Status: AC
  Administered 2013-12-24: 1000 mL via INTRAVENOUS

## 2013-12-24 MED ORDER — POTASSIUM CHLORIDE CRYS ER 20 MEQ PO TBCR
40.0000 meq | EXTENDED_RELEASE_TABLET | Freq: Once | ORAL | Status: AC
  Administered 2013-12-24: 40 meq via ORAL
  Filled 2013-12-24: qty 2

## 2013-12-24 MED ORDER — IOHEXOL 300 MG/ML  SOLN
50.0000 mL | Freq: Once | INTRAMUSCULAR | Status: AC | PRN
  Administered 2013-12-24: 50 mL via ORAL

## 2013-12-24 MED ORDER — CIPROFLOXACIN HCL 500 MG PO TABS: 500.0000 mg | ORAL_TABLET | Freq: Two times a day (BID) | ORAL | Status: DC

## 2013-12-24 MED ORDER — CEPHALEXIN 250 MG PO CAPS: 250.0000 mg | ORAL_CAPSULE | Freq: Two times a day (BID) | ORAL | Status: DC

## 2013-12-24 MED ORDER — DEXTROSE 5 % IV SOLN
1.0000 g | Freq: Once | INTRAVENOUS | Status: AC
  Administered 2013-12-24: 1 g via INTRAVENOUS
  Filled 2013-12-24: qty 10

## 2013-12-24 NOTE — ED Notes (Signed)
Patient back from CT dept Patient remains in NAD

## 2013-12-24 NOTE — ED Provider Notes (Signed)
Medical screening examination/treatment/procedure(s) were performed by non-physician practitioner and as supervising physician I was immediately available for consultation/collaboration.   EKG Interpretation None        Joshua M Zavitz, MD 12/24/13 0706 

## 2013-12-24 NOTE — ED Notes (Signed)
Patient taken to CT dept via stretcher Patient in NAD upon leaving for testing

## 2013-12-24 NOTE — Discharge Instructions (Signed)
Pyelonephritis, Adult °Pyelonephritis is a kidney infection. In general, there are 2 main types of pyelonephritis: °· Infections that come on quickly without any warning (acute pyelonephritis). °· Infections that persist for a long period of time (chronic pyelonephritis). °CAUSES  °Two main causes of pyelonephritis are: °· Bacteria traveling from the bladder to the kidney. This is a problem especially in pregnant women. The urine in the bladder can become filled with bacteria from multiple causes, including: °¨ Inflammation of the prostate gland (prostatitis). °¨ Sexual intercourse in females. °¨ Bladder infection (cystitis). °· Bacteria traveling from the bloodstream to the tissue part of the kidney. °Problems that may increase your risk of getting a kidney infection include: °· Diabetes. °· Kidney stones or bladder stones. °· Cancer. °· Catheters placed in the bladder. °· Other abnormalities of the kidney or ureter. °SYMPTOMS  °· Abdominal pain. °· Pain in the side or flank area. °· Fever. °· Chills. °· Upset stomach. °· Blood in the urine (dark urine). °· Frequent urination. °· Strong or persistent urge to urinate. °· Burning or stinging when urinating. °DIAGNOSIS  °Your caregiver may diagnose your kidney infection based on your symptoms. A urine sample may also be taken. °TREATMENT  °In general, treatment depends on how severe the infection is.  °· If the infection is mild and caught early, your caregiver may treat you with oral antibiotics and send you home. °· If the infection is more severe, the bacteria may have gotten into the bloodstream. This will require intravenous (IV) antibiotics and a hospital stay. Symptoms may include: °¨ High fever. °¨ Severe flank pain. °¨ Shaking chills. °· Even after a hospital stay, your caregiver may require you to be on oral antibiotics for a period of time. °· Other treatments may be required depending upon the cause of the infection. °HOME CARE INSTRUCTIONS  °· Take your  antibiotics as directed. Finish them even if you start to feel better. °· Make an appointment to have your urine checked to make sure the infection is gone. °· Drink enough fluids to keep your urine clear or pale yellow. °· Take medicines for the bladder if you have urgency and frequency of urination as directed by your caregiver. °SEEK IMMEDIATE MEDICAL CARE IF:  °· You have a fever or persistent symptoms for more than 2-3 days. °· You have a fever and your symptoms suddenly get worse. °· You are unable to take your antibiotics or fluids. °· You develop shaking chills. °· You experience extreme weakness or fainting. °· There is no improvement after 2 days of treatment. °MAKE SURE YOU: °· Understand these instructions. °· Will watch your condition. °· Will get help right away if you are not doing well or get worse. °Document Released: 06/15/2005 Document Revised: 12/15/2011 Document Reviewed: 11/19/2010 °[employer]® Patient Information ©2015 [employer]. This information is not intended to replace advice given to you by your health care provider. Make sure you discuss any questions you have with your health care provider. ° °

## 2013-12-24 NOTE — ED Provider Notes (Signed)
CSN: [id number]     Arrival date & time 12/23/13  2210 History   First MD Initiated Contact with Patient 12/24/13 [phone]     Chief Complaint  Patient presents with  . Abdominal Pain    (Consider location/radiation/quality/duration/timing/severity/associated sxs/prior Treatment) HPI Comments: Patient is a 38 year old female with a history of hypertension and ADD who presents to the emergency department today for mid back pain. Patient states that back pain began this morning and was proceeded by turning dysuria, chills, nausea, and nonbloody, nonbilious emesis. Patient also states that she has been having a discomfort in her lower abdominal region. Patient states that her chills worsened today at which time she took her temperature and found it to be 104.37F orally. Patient took Tylenol prior to arrival with improvement in her temperature. Patient denies associated syncope, chest pain, shortness of breath, hematemesis, melena or hematochezia, diarrhea, hematuria, vaginal bleeding or vaginal discharge, numbness/tingling, and weakness.   Patient does state she has a history of sigmoid diverticulitis. She states that she was told that she had a fistula. She states that intermittently she feels as though she passes gas from her vaginal canal. She also states that she has sporadically had fecal matter in her vaginal canal.  The history is provided by the patient. No language interpreter was used.    Past Medical History  Diagnosis Date  . Hypertension   . ADD (attention deficit disorder)   . Rosacea    Past Surgical History  Procedure Laterality Date  . Wisdom tooth extraction     Family History  Problem Relation Age of Onset  . Cancer Mother     Colon, Cervical   . Hypertension Father   . Osteoporosis Paternal Grandmother   . Hypertension Paternal Grandmother   . Cancer Paternal Grandfather     Bladder, Throat    History  Substance Use Topics  . Smoking status: Current Every Day Smoker  -- 0.50 packs/day    Types: Cigarettes  . Smokeless tobacco: Current User     Comment: She is trying E-Cigarrettes to quit smoking   . Alcohol Use: No   OB History   Grav Para Term Preterm Abortions TAB SAB Ect Mult Living                  Review of Systems  Constitutional: Positive for fever and chills.  Respiratory: Negative for shortness of breath.   Cardiovascular: Negative for chest pain.  Gastrointestinal: Positive for nausea, vomiting and abdominal pain (suprapubic).  Genitourinary: Positive for dysuria and flank pain. Negative for hematuria, vaginal bleeding and vaginal discharge.  Neurological: Negative for syncope.  All other systems reviewed and are negative.    Allergies  Review of patient's allergies indicates no known allergies.  Home Medications   Prior to Admission medications   Medication Sig Start Date End Date Taking? Authorizing Provider  HYDROcodone-acetaminophen (NORCO) 10-325 MG per tablet Take 1 tablet by mouth 3 (three) times daily.   Yes Historical Provider, MD  lisdexamfetamine (VYVANSE) 40 MG capsule Take 40 mg by mouth daily as needed (for focus).   Yes Historical Provider, MD  methocarbamol (ROBAXIN) 750 MG tablet Take 750 mg by mouth 3 (three) times daily as needed for muscle spasms. For back pain   Yes Historical Provider, MD  Multiple Vitamin (MULTIVITAMIN WITH MINERALS) TABS tablet Take 1 tablet by mouth daily.   Yes Historical Provider, MD  valsartan-hydrochlorothiazide (DIOVAN HCT) 160-25 MG per tablet Take 1 tablet by mouth daily. 11/27/13  11/28/14 Yes Gillian Scarce, MD   BP 117/67  Pulse 89  Temp(Src) 98.1 F (36.7 C) (Oral)  Resp 20  Wt 150 lb (68.04 kg)  SpO2 96%  Physical Exam  Nursing note and vitals reviewed. Constitutional: She is oriented to person, place, and time. She appears well-developed and well-nourished. No distress.  Nontoxic/nonseptic appearing  HENT:  Head: Normocephalic and atraumatic.  Mouth/Throat: Oropharynx is  clear and moist. No oropharyngeal exudate.  Eyes: Conjunctivae and EOM are normal. No scleral icterus.  Neck: Normal range of motion.  Cardiovascular: Regular rhythm, normal heart sounds and intact distal pulses.   borderline tachycardia.  Pulmonary/Chest: Effort normal and breath sounds normal. No respiratory distress. She has no wheezes. She has no rales.  Abdominal: Soft. She exhibits no mass. There is tenderness (suprapubic, mild, as well as CVA TTP b/l). There is no rebound and no guarding.  No peritoneal signs  Genitourinary: There is no rash, tenderness, lesion or injury on the right labia. There is no rash, tenderness, lesion or injury on the left labia. Uterus is not tender. Cervix exhibits no motion tenderness and no friability. Right adnexum displays no mass, no tenderness and no fullness. Left adnexum displays no mass, no tenderness and no fullness. No erythema or bleeding around the vagina. Vaginal discharge (white, creamy) found.  No obvious fistula to vaginal vault. No CMT or adnexal TTP.  Musculoskeletal: Normal range of motion.  Neurological: She is alert and oriented to person, place, and time.  GCS 15. Patient moves extremities without ataxia.  Skin: Skin is warm and dry. No rash noted. She is not diaphoretic. No erythema. No pallor.  Psychiatric: She has a normal mood and affect. Her behavior is normal.    ED Course  Procedures (including critical care time) Labs Review Labs Reviewed  WET PREP, GENITAL - Abnormal; Notable for the following:    WBC, Wet Prep HPF POC MANY (*)    All other components within normal limits  CBC WITH DIFFERENTIAL - Abnormal; Notable for the following:    WBC 17.9 (*)    Neutrophils Relative % 82 (*)    Neutro Abs 14.7 (*)    Lymphocytes Relative 10 (*)    Monocytes Absolute 1.4 (*)    All other components within normal limits  COMPREHENSIVE METABOLIC PANEL - Abnormal; Notable for the following:    Sodium 134 (*)    Potassium 3.4 (*)     Chloride 95 (*)    Glucose, Bld 172 (*)    All other components within normal limits  URINALYSIS, ROUTINE W REFLEX MICROSCOPIC - Abnormal; Notable for the following:    APPearance TURBID (*)    Hgb urine dipstick LARGE (*)    Protein, ur >300 (*)    Nitrite POSITIVE (*)    Leukocytes, UA MODERATE (*)    All other components within normal limits  URINE MICROSCOPIC-ADD ON - Abnormal; Notable for the following:    Bacteria, UA MANY (*)    All other components within normal limits  GC/CHLAMYDIA PROBE AMP  URINE CULTURE  LIPASE, BLOOD  POC URINE PREG, ED  I-STAT CG4 LACTIC ACID, ED    Imaging Review Ct Abdomen Pelvis W Contrast  12/24/2013   CLINICAL DATA:  Pain with fever and nausea and vomiting.  EXAM: CT ABDOMEN AND PELVIS WITH CONTRAST  TECHNIQUE: Multidetector CT imaging of the abdomen and pelvis was performed using the standard protocol following bolus administration of intravenous contrast.  CONTRAST:  OMNIPAQUE  IOHEXOL 300 MG/ML  SOLN  COMPARISON:  04/03/2013  FINDINGS: BODY WALL: Unremarkable.  LOWER CHEST: Unremarkable.  ABDOMEN/PELVIS:  Liver: No focal abnormality.  Biliary: No evidence of biliary obstruction or stone.  Pancreas: Unremarkable.  Spleen: Unremarkable.  Adrenals: Unremarkable.  Kidneys and ureters: Right nephromegaly with patchy striated hypo enhancement. In the anterior interpolar region there is a 11 mm area of rounded low-attenuation. There is asymmetric right perinephric and periureteric edema. The right ureteral urothelium is avidly enhancing compared to the left.  Bladder: Diffuse bladder wall thickening.  Reproductive: IUD which is in good position.  Bowel: Previous sigmoid diverticulitis. There is persistent extra luminal gas neighboring the previously inflamed segment, with a tiny locule of gas still noted. Mild enlargement of the sigmoid mesenteric lymph nodes, likely reactive to the chronic inflammation. Normal appendix.  Peritoneum: No ascites or  pneumoperitoneum.  Vascular: No acute abnormality.  OSSEOUS: No acute abnormalities.  IMPRESSION: 1. Right pyelonephritis. There is no abscess, but if blunted response to treatment recommend followup imaging. No hydronephrosis. 2. 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   Electronically Signed   By: Tiburcio PeaJonathan  Watts M.D.   On: 12/24/2013 03:58     EKG Interpretation None      MDM   Final diagnoses:  Pyelonephritis  Sigmoidovaginal fistula  Hypokalemia    38 year old female presents to the emergency department for lower abdominal pain, dysuria, and back pain associated with fever, chills, nausea, and emesis. Symptoms, physical exam, and UA consistent with dx pyelonephritis. Patient afebrile on arrival as well as hemodynamically stable. She has been treated empirically with 1 g Rocephin.  CT abdomen and pelvis ordered for further evaluation given history of endorsed fistula and leukocytosis of 17.9. CT abdomen and pelvis confirms right pyelonephritis. No abscess formation, however, low-attenuation fluid is visualized. Should symptoms persist or worsen, believe repeat imaging is indicated. 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 Possible UTI stemming from patient's sigmoidvaginal fistula given her endorsed hx of sporadic fecal matter in her vaginal canal.  Symptoms improved over ED course with IV narcotics and Zofran. Patient also hydrated with 1 L IV fluids. Believe patient is stable for discharge today given her improvement over ED course as well as her normal lactate. Patient will be prescribed ciprofloxacin, Zofran, and Norco for symptom management. Return precautions discussed and provided. Patient agreeable to plan today with no unaddressed concerns.   Filed Vitals:   12/23/13 2303 12/24/13 0201 12/24/13 0357  BP: 125/65  117/67  Pulse: 101  89  Temp: 98.9 F (37.2 C) 99.5 F (37.5 C) 98.1 F (36.7 C)  TempSrc: Oral Oral Oral   Resp: 20  20  Weight: 150 lb (68.04 kg)    SpO2: 96%  96%       Antony MaduraKelly Itha Kroeker, PA-C 12/24/13 [phone]

## 2013-12-24 NOTE — ED Notes (Addendum)
Patient informed that she needs to wait at least 30 minutes after admin of Morphine IV to re-assess VS and to ensure that no adverse reaction occurs Patient states that she was under the impression that she would be given pain medication and immediately D/C home Patient upset that she would have to wait Patient refused Morphine IV, see MAR Patient states that she wants to be discharged Patient does not want to wait for DC VS to be obtained

## 2013-12-25 LAB — GC/CHLAMYDIA PROBE AMP
CT Probe RNA: NEGATIVE
GC PROBE AMP APTIMA: NEGATIVE

## 2013-12-26 LAB — URINE CULTURE

## 2013-12-27 ENCOUNTER — Other Ambulatory Visit: Payer: Medicaid Other

## 2013-12-27 ENCOUNTER — Telehealth (HOSPITAL_BASED_OUTPATIENT_CLINIC_OR_DEPARTMENT_OTHER): Payer: Self-pay

## 2013-12-27 NOTE — Telephone Encounter (Signed)
Post ED Visit - Positive Culture Follow-up  Culture report reviewed by antimicrobial stewardship pharmacist: [x]  Wes Dulaney, Pharm.D., BCPS []  Celedonio MiyamotoJeremy Frens, Pharm.D., BCPS []  Georgina PillionElizabeth Martin, Pharm.D., BCPS []  VeronaMinh Pham, [address] Rainbow BoulevardPharm.D., BCPS, AAHIVP []  Estella HuskMichelle Turner, Pharm.D., BCPS, AAHIVP []    Positive Urine culture, >/= 100,000 colonies -> E Coli Treated with Cephalexin, organism sensitive to the same and no further patient follow-up is required at this time.  Arvid RightClark, Caprice Mccaffrey Dorn 12/27/2013, 2:46 PM

## 2014-01-01 ENCOUNTER — Encounter: Payer: Self-pay | Admitting: Family Medicine

## 2014-01-01 ENCOUNTER — Other Ambulatory Visit (HOSPITAL_COMMUNITY)
Admission: RE | Admit: 2014-01-01 | Discharge: 2014-01-01 | Disposition: A | Discharge status: Home / Self Care | Payer: Medicaid Other | Source: Ambulatory Visit | Attending: Family Medicine | Admitting: Family Medicine

## 2014-01-01 ENCOUNTER — Ambulatory Visit (INDEPENDENT_AMBULATORY_CARE_PROVIDER_SITE_OTHER): Payer: Medicaid Other | Admitting: Family Medicine

## 2014-01-01 VITALS — BP 113/75 | HR 65 | Resp 16 | Ht 67.0 in | Wt 152.0 lb

## 2014-01-01 DIAGNOSIS — Z1151 Encounter for screening for human papillomavirus (HPV): Secondary | ICD-10-CM | POA: Diagnosis present

## 2014-01-01 DIAGNOSIS — K632 Fistula of intestine: Secondary | ICD-10-CM

## 2014-01-01 DIAGNOSIS — B360 Pityriasis versicolor: Secondary | ICD-10-CM

## 2014-01-01 DIAGNOSIS — Z01419 Encounter for gynecological examination (general) (routine) without abnormal findings: Secondary | ICD-10-CM | POA: Diagnosis present

## 2014-01-01 DIAGNOSIS — R41840 Attention and concentration deficit: Secondary | ICD-10-CM

## 2014-01-01 DIAGNOSIS — Z0000 Encounter for general adult medical examination without abnormal findings: Secondary | ICD-10-CM

## 2014-01-01 DIAGNOSIS — Z124 Encounter for screening for malignant neoplasm of cervix: Secondary | ICD-10-CM

## 2014-01-01 DIAGNOSIS — I10 Essential (primary) hypertension: Secondary | ICD-10-CM

## 2014-01-01 DIAGNOSIS — B351 Tinea unguium: Secondary | ICD-10-CM

## 2014-01-01 LAB — POCT URINALYSIS DIPSTICK
Bilirubin, UA: NEGATIVE
Glucose, UA: NEGATIVE
Ketones, UA: NEGATIVE
Nitrite, UA: NEGATIVE
Protein, UA: NEGATIVE
Spec Grav, UA: 1.015
Urobilinogen, UA: NEGATIVE
pH, UA: 7

## 2014-01-01 MED ORDER — LISDEXAMFETAMINE DIMESYLATE 40 MG PO CAPS: 40.0000 mg | ORAL_CAPSULE | Freq: Every day | ORAL | Status: DC | PRN

## 2014-01-01 MED ORDER — VALSARTAN-HYDROCHLOROTHIAZIDE 160-25 MG PO TABS: 1.0000 | ORAL_TABLET | Freq: Every day | ORAL | Status: DC

## 2014-01-01 MED ORDER — TERBINAFINE HCL 250 MG PO TABS: 250.0000 mg | ORAL_TABLET | Freq: Every day | ORAL | Status: DC

## 2014-01-01 MED ORDER — KETOCONAZOLE 2 % EX CREA: 1.0000 "application " | TOPICAL_CREAM | Freq: Two times a day (BID) | CUTANEOUS | Status: DC

## 2014-01-01 NOTE — Progress Notes (Signed)
Subjective:    Patient ID: Brandy Vaughn, female    DOB: 03/28/1976, 38 y.o.   MRN: [id number]  HPI  Brandy Vaughn is here today for her annual CPE/PAP. She was recently seen in the ED for a kidney infection. She is finishing up the Cipro. She is still having some lower abdominal pain and back pain.    Review of Systems  Constitutional: Positive for fatigue. Negative for fever, activity change and appetite change.  Cardiovascular: Negative for chest pain, palpitations and leg swelling.  Gastrointestinal: Positive for abdominal pain.  Genitourinary: Positive for vaginal discharge (She is having stool leak from her vagina.  ). Negative for dysuria.  Musculoskeletal: Positive for back pain.  Psychiatric/Behavioral: Negative for behavioral problems. The patient is not nervous/anxious.   All other systems reviewed and are negative.    Past Medical History  Diagnosis Date  . Hypertension   . ADD (attention deficit disorder)   . Rosacea   . Vaginal tear resulting from childbirth     3rd degree  . Chronic back pain     on chronic narcotics     Past Surgical History  Procedure Laterality Date  . Wisdom tooth extraction       History   Social History Narrative   Marital Status:  Separated (Richard)    Children:  Daughters (2)    Pets: Dog (Zoey)    Living Situation: Lives with children.    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    Alcohol Use:  Occasional   Drug Use:  None   Diet:  Regular   Exercise:  Limited    Hobbies:  Reading      Family History  Problem Relation Age of Onset  . Cancer Mother     Colon, Cervical   . Hypertension Father   . Osteoporosis Paternal Grandmother   . Hypertension Paternal Grandmother   . Cancer Paternal Grandfather     Bladder, Throat      Current Outpatient Prescriptions on File Prior to Visit  Medication Sig Dispense Refill  . HYDROcodone-acetaminophen (NORCO)  10-325 MG per tablet Take 1 tablet by mouth 3 (three) times daily.  15 tablet  0  . methocarbamol (ROBAXIN) 750 MG tablet Take 750 mg by mouth 3 (three) times daily as needed for muscle spasms. For back pain      . Multiple Vitamin (MULTIVITAMIN WITH MINERALS) TABS tablet Take 1 tablet by mouth daily.      . ondansetron (ZOFRAN) 4 MG tablet Take 1 tablet (4 mg total) by mouth every 6 (six) hours.  12 tablet  0   No current facility-administered medications on file prior to visit.     No Known Allergies     Objective:   Physical Exam  Nursing note and vitals reviewed. Constitutional: She is oriented to person, place, and time. She appears well-nourished.  HENT:  Head: Normocephalic.  Neck: Normal range of motion.  Pulmonary/Chest: Effort normal.  Musculoskeletal: Normal range of motion.  Neurological: She is oriented to person, place, and time.  Skin: Skin is warm and dry.  Psychiatric: She has a normal mood and affect. Her behavior is normal. Judgment and thought content normal.      Assessment & Plan:    Brandy Vaughn was seen today for annual exam.  Diagnoses and associated orders for this visit:  Routine general medical examination at a health care facility -  POCT urinalysis dipstick  Screening for malignant neoplasm of the cervix - Cytology - PAP  Fistula of intestine Comments: Referring to surgery to have this fistula evalutated.   - Ambulatory referral to General Surgery  TV (tinea versicolor) - ketoconazole (NIZORAL) 2 % cream; Apply 1 application topically 2 (two) times daily. - terbinafine (LAMISIL) 250 MG tablet; Take 1 tablet (250 mg total) by mouth daily.  Essential hypertension, benign - valsartan-hydrochlorothiazide (DIOVAN HCT) 160-25 MG per tablet; Take 1 tablet by mouth daily.  Concentration deficit  - lisdexamfetamine (VYVANSE) 40 MG capsule; Take 1 capsule (40 mg total) by mouth daily as needed (for focus). Refilled for 4 months.    Nail  fungus - terbinafine (LAMISIL) 250 MG tablet; Take 1 tablet (250 mg total) by mouth daily.

## 2014-01-03 LAB — CYTOLOGY - PAP

## 2014-01-04 ENCOUNTER — Encounter (HOSPITAL_COMMUNITY): Payer: Self-pay | Admitting: Emergency Medicine

## 2014-01-04 ENCOUNTER — Emergency Department (HOSPITAL_COMMUNITY): Payer: Medicaid Other

## 2014-01-04 ENCOUNTER — Inpatient Hospital Stay (HOSPITAL_COMMUNITY)
Admission: EM | Admit: 2014-01-04 | Discharge: 2014-01-10 | DRG: 394 | Disposition: A | Discharge status: Home / Self Care | Payer: Medicaid Other | Attending: General Surgery | Admitting: General Surgery

## 2014-01-04 DIAGNOSIS — R599 Enlarged lymph nodes, unspecified: Secondary | ICD-10-CM | POA: Diagnosis present

## 2014-01-04 DIAGNOSIS — G8929 Other chronic pain: Secondary | ICD-10-CM | POA: Diagnosis present

## 2014-01-04 DIAGNOSIS — F17200 Nicotine dependence, unspecified, uncomplicated: Secondary | ICD-10-CM | POA: Diagnosis present

## 2014-01-04 DIAGNOSIS — F988 Other specified behavioral and emotional disorders with onset usually occurring in childhood and adolescence: Secondary | ICD-10-CM | POA: Diagnosis present

## 2014-01-04 DIAGNOSIS — Z79899 Other long term (current) drug therapy: Secondary | ICD-10-CM

## 2014-01-04 DIAGNOSIS — K5792 Diverticulitis of intestine, part unspecified, without perforation or abscess without bleeding: Secondary | ICD-10-CM

## 2014-01-04 DIAGNOSIS — R590 Localized enlarged lymph nodes: Secondary | ICD-10-CM | POA: Diagnosis present

## 2014-01-04 DIAGNOSIS — R1032 Left lower quadrant pain: Secondary | ICD-10-CM | POA: Diagnosis present

## 2014-01-04 DIAGNOSIS — M545 Low back pain, unspecified: Secondary | ICD-10-CM | POA: Diagnosis present

## 2014-01-04 DIAGNOSIS — Z975 Presence of (intrauterine) contraceptive device: Secondary | ICD-10-CM

## 2014-01-04 DIAGNOSIS — K5732 Diverticulitis of large intestine without perforation or abscess without bleeding: Secondary | ICD-10-CM | POA: Diagnosis present

## 2014-01-04 DIAGNOSIS — D72829 Elevated white blood cell count, unspecified: Secondary | ICD-10-CM | POA: Diagnosis present

## 2014-01-04 DIAGNOSIS — R109 Unspecified abdominal pain: Secondary | ICD-10-CM | POA: Diagnosis present

## 2014-01-04 DIAGNOSIS — N824 Other female intestinal-genital tract fistulae: Principal | ICD-10-CM | POA: Diagnosis present

## 2014-01-04 DIAGNOSIS — I10 Essential (primary) hypertension: Secondary | ICD-10-CM | POA: Diagnosis present

## 2014-01-04 DIAGNOSIS — Z8742 Personal history of other diseases of the female genital tract: Secondary | ICD-10-CM

## 2014-01-04 HISTORY — DX: Dorsalgia, unspecified: M54.9

## 2014-01-04 HISTORY — DX: Other chronic pain: G89.29

## 2014-01-04 LAB — COMPREHENSIVE METABOLIC PANEL
ALK PHOS: 72 U/L (ref 39–117)
ALT: 16 U/L (ref 0–35)
ANION GAP: 14 (ref 5–15)
AST: 24 U/L (ref 0–37)
Albumin: 4.2 g/dL (ref 3.5–5.2)
BILIRUBIN TOTAL: 0.2 mg/dL — AB (ref 0.3–1.2)
BUN: 20 mg/dL (ref 6–23)
CO2: 26 mEq/L (ref 19–32)
Calcium: 10.1 mg/dL (ref 8.4–10.5)
Chloride: 90 mEq/L — ABNORMAL LOW (ref 96–112)
Creatinine, Ser: 0.7 mg/dL (ref 0.50–1.10)
GFR calc non Af Amer: >90 mL/min (ref >90)
Glucose, Bld: 83 mg/dL (ref 70–99)
POTASSIUM: 5.7 meq/L — AB (ref 3.7–5.3)
Sodium: 130 mEq/L — ABNORMAL LOW (ref 137–147)
TOTAL PROTEIN: 8 g/dL (ref 6.0–8.3)

## 2014-01-04 LAB — CBC WITH DIFFERENTIAL/PLATELET
Basophils Absolute: 0.1 10*3/uL (ref 0.0–0.1)
Basophils Relative: 0 % (ref 0–1)
Eosinophils Absolute: 0.2 10*3/uL (ref 0.0–0.7)
Eosinophils Relative: 1 % (ref 0–5)
HCT: 42.6 % (ref 36.0–46.0)
Hemoglobin: 14.5 g/dL (ref 12.0–15.0)
Lymphocytes Relative: 18 % (ref 12–46)
Lymphs Abs: 3.2 10*3/uL (ref 0.7–4.0)
MCH: 31.9 pg (ref 26.0–34.0)
MCHC: 34 g/dL (ref 30.0–36.0)
MCV: 93.8 fL (ref 78.0–100.0)
MONOS PCT: 5 % (ref 3–12)
Monocytes Absolute: 0.8 10*3/uL (ref 0.1–1.0)
NEUTROS PCT: 76 % (ref 43–77)
Neutro Abs: 13.4 10*3/uL — ABNORMAL HIGH (ref 1.7–7.7)
Platelets: 630 10*3/uL — ABNORMAL HIGH (ref 150–400)
RBC: 4.54 MIL/uL (ref 3.87–5.11)
RDW: 12.8 % (ref 11.5–15.5)
WBC: 17.7 10*3/uL — ABNORMAL HIGH (ref 4.0–10.5)

## 2014-01-04 LAB — URINE MICROSCOPIC-ADD ON

## 2014-01-04 LAB — URINALYSIS, ROUTINE W REFLEX MICROSCOPIC
BILIRUBIN URINE: NEGATIVE
Glucose, UA: NEGATIVE mg/dL
Hgb urine dipstick: NEGATIVE
KETONES UR: NEGATIVE mg/dL
NITRITE: NEGATIVE
PROTEIN: NEGATIVE mg/dL
SPECIFIC GRAVITY, URINE: 1.007 (ref 1.005–1.030)
UROBILINOGEN UA: 0.2 mg/dL (ref 0.0–1.0)
pH: 6.5 (ref 5.0–8.0)

## 2014-01-04 LAB — LIPASE, BLOOD: Lipase: 62 U/L — ABNORMAL HIGH (ref 11–59)

## 2014-01-04 LAB — PREGNANCY, URINE: Preg Test, Ur: NEGATIVE

## 2014-01-04 MED ORDER — VALSARTAN-HYDROCHLOROTHIAZIDE 160-25 MG PO TABS: 1.0000 | ORAL_TABLET | Freq: Every day | ORAL | Status: DC

## 2014-01-04 MED ORDER — DEXTROSE-NACL 5-0.9 % IV SOLN
INTRAVENOUS | Status: DC
  Administered 2014-01-04 – 2014-01-05 (×2): via INTRAVENOUS
  Administered 2014-01-06 – 2014-01-07 (×2): 75 mL/h via INTRAVENOUS
  Administered 2014-01-08 – 2014-01-10 (×2): via INTRAVENOUS

## 2014-01-04 MED ORDER — SODIUM CHLORIDE 0.9 % IV BOLUS (SEPSIS)
1000.0000 mL | Freq: Once | INTRAVENOUS | Status: AC
  Administered 2014-01-04: 1000 mL via INTRAVENOUS

## 2014-01-04 MED ORDER — ONDANSETRON HCL 4 MG/2ML IJ SOLN
4.0000 mg | INTRAMUSCULAR | Status: DC | PRN
  Administered 2014-01-04 – 2014-01-07 (×4): 4 mg via INTRAVENOUS
  Filled 2014-01-04 (×4): qty 2

## 2014-01-04 MED ORDER — IRBESARTAN 150 MG PO TABS
150.0000 mg | ORAL_TABLET | Freq: Every day | ORAL | Status: DC
  Administered 2014-01-06 – 2014-01-10 (×4): 150 mg via ORAL
  Filled 2014-01-04 (×6): qty 1

## 2014-01-04 MED ORDER — HYDROCHLOROTHIAZIDE 25 MG PO TABS
25.0000 mg | ORAL_TABLET | Freq: Every day | ORAL | Status: DC
  Administered 2014-01-06 – 2014-01-10 (×4): 25 mg via ORAL
  Filled 2014-01-04 (×6): qty 1

## 2014-01-04 MED ORDER — ONDANSETRON HCL 4 MG/2ML IJ SOLN
4.0000 mg | Freq: Once | INTRAMUSCULAR | Status: AC
  Administered 2014-01-04: 4 mg via INTRAVENOUS
  Filled 2014-01-04: qty 2

## 2014-01-04 MED ORDER — MORPHINE SULFATE 2 MG/ML IJ SOLN
2.0000 mg | INTRAMUSCULAR | Status: DC | PRN
  Administered 2014-01-04: 2 mg via INTRAVENOUS
  Administered 2014-01-05: 4 mg via INTRAVENOUS
  Administered 2014-01-05 (×3): 2 mg via INTRAVENOUS
  Administered 2014-01-05: 4 mg via INTRAVENOUS
  Administered 2014-01-05 (×2): 2 mg via INTRAVENOUS
  Administered 2014-01-06: 4 mg via INTRAVENOUS
  Administered 2014-01-06: 2 mg via INTRAVENOUS
  Administered 2014-01-06 (×2): 4 mg via INTRAVENOUS
  Administered 2014-01-06 (×2): 6 mg via INTRAVENOUS
  Administered 2014-01-06 (×2): 4 mg via INTRAVENOUS
  Administered 2014-01-07 – 2014-01-08 (×7): 6 mg via INTRAVENOUS
  Filled 2014-01-04: qty 1
  Filled 2014-01-04: qty 3
  Filled 2014-01-04: qty 2
  Filled 2014-01-04: qty 1
  Filled 2014-01-04: qty 3
  Filled 2014-01-04 (×2): qty 2
  Filled 2014-01-04: qty 1
  Filled 2014-01-04: qty 3
  Filled 2014-01-04: qty 1
  Filled 2014-01-04: qty 3
  Filled 2014-01-04: qty 2
  Filled 2014-01-04 (×2): qty 3
  Filled 2014-01-04: qty 2
  Filled 2014-01-04: qty 1
  Filled 2014-01-04: qty 2
  Filled 2014-01-04: qty 3
  Filled 2014-01-04: qty 2
  Filled 2014-01-04: qty 1
  Filled 2014-01-04: qty 3
  Filled 2014-01-04: qty 2
  Filled 2014-01-04: qty 3

## 2014-01-04 MED ORDER — IOHEXOL 300 MG/ML  SOLN
100.0000 mL | Freq: Once | INTRAMUSCULAR | Status: AC | PRN
  Administered 2014-01-04: 100 mL via INTRAVENOUS

## 2014-01-04 MED ORDER — ENOXAPARIN SODIUM 40 MG/0.4ML ~~LOC~~ SOLN
40.0000 mg | SUBCUTANEOUS | Status: DC
  Administered 2014-01-04 – 2014-01-09 (×6): 40 mg via SUBCUTANEOUS
  Filled 2014-01-04 (×7): qty 0.4

## 2014-01-04 MED ORDER — LISDEXAMFETAMINE DIMESYLATE 20 MG PO CAPS: 40.0000 mg | ORAL_CAPSULE | Freq: Every day | ORAL | Status: DC | PRN

## 2014-01-04 MED ORDER — MORPHINE SULFATE 4 MG/ML IJ SOLN
4.0000 mg | Freq: Once | INTRAMUSCULAR | Status: AC
  Administered 2014-01-04: 4 mg via INTRAVENOUS
  Filled 2014-01-04: qty 1

## 2014-01-04 MED ORDER — METHOCARBAMOL 500 MG PO TABS: 750.0000 mg | ORAL_TABLET | Freq: Three times a day (TID) | ORAL | Status: DC | PRN

## 2014-01-04 MED ORDER — PANTOPRAZOLE SODIUM 40 MG IV SOLR
40.0000 mg | Freq: Every day | INTRAVENOUS | Status: DC
  Administered 2014-01-04: 40 mg via INTRAVENOUS
  Filled 2014-01-04: qty 40

## 2014-01-04 MED ORDER — PIPERACILLIN-TAZOBACTAM 3.375 G IVPB
3.3750 g | Freq: Three times a day (TID) | INTRAVENOUS | Status: DC
  Administered 2014-01-05: 3.375 g via INTRAVENOUS
  Filled 2014-01-04: qty 50

## 2014-01-04 MED ORDER — PIPERACILLIN-TAZOBACTAM 3.375 G IVPB: 3.3750 g | Freq: Once | INTRAVENOUS | Status: DC

## 2014-01-04 MED ORDER — TERBINAFINE HCL 250 MG PO TABS
250.0000 mg | ORAL_TABLET | Freq: Every day | ORAL | Status: DC
  Administered 2014-01-04 – 2014-01-10 (×7): 250 mg via ORAL
  Filled 2014-01-04 (×7): qty 1

## 2014-01-04 MED ORDER — PIPERACILLIN-TAZOBACTAM 3.375 G IVPB 30 MIN
3.3750 g | Freq: Once | INTRAVENOUS | Status: AC
  Administered 2014-01-04: 3.375 g via INTRAVENOUS
  Filled 2014-01-04: qty 50

## 2014-01-04 MED ORDER — IOHEXOL 300 MG/ML  SOLN
50.0000 mL | Freq: Once | INTRAMUSCULAR | Status: AC | PRN
  Administered 2014-01-04: 50 mL via ORAL

## 2014-01-04 NOTE — ED Notes (Addendum)
Patient transported to CT 

## 2014-01-04 NOTE — Progress Notes (Signed)
Utilization Review completed.  Nida Manfredi RN CM  

## 2014-01-04 NOTE — ED Notes (Signed)
MD at bedside. 

## 2014-01-04 NOTE — ED Provider Notes (Signed)
CSN: [id number]     Arrival date & time 01/04/14  1430 History   First MD Initiated Contact with Patient 01/04/14 1524     Chief Complaint  Patient presents with  . Back Pain  . 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  She finished her 10 day course of cipro two days ago.  She began feeling dizzy again yesterday.  She developed right flank pain, malaise, and fevers today.    Patient is a 38 y.o. female presenting with flank pain.  Flank Pain This is a recurrent problem. The current episode started yesterday. The problem occurs constantly. The problem has been gradually worsening. Associated symptoms include abdominal pain. Pertinent negatives include no chest pain and no shortness of breath. Associated symptoms comments: Fevers, urinary frequency. Nothing aggravates the symptoms. Nothing relieves the symptoms. She has tried acetaminophen for the symptoms. The treatment provided no relief.    Past Medical History  Diagnosis Date  . Hypertension   . ADD (attention deficit disorder)   . Rosacea    Past Surgical History  Procedure Laterality Date  . Wisdom tooth extraction     Family History  Problem Relation Age of Onset  . Cancer Mother     Colon, Cervical   . Hypertension Father   . Osteoporosis Paternal Grandmother   . Hypertension Paternal Grandmother   . Cancer Paternal Grandfather     Bladder, Throat    History  Substance Use Topics  . Smoking status: Current Every Day Smoker -- 0.50 packs/day    Types: Cigarettes  . Smokeless tobacco: Current User     Comment: She is trying E-Cigarrettes to quit smoking   . Alcohol Use: No   OB History   Grav Para Term Preterm Abortions TAB SAB Ect Mult Living                 Review of Systems   Respiratory: Negative for shortness of breath.   Cardiovascular: Negative for chest pain.  Gastrointestinal: Positive for abdominal pain.  Genitourinary: Positive for flank pain.  All other systems reviewed and are negative.     Allergies  Review of patient's allergies indicates no known allergies.  Home Medications   Prior to Admission medications   Medication Sig Start Date End Date Taking? Authorizing Provider  ciprofloxacin (CIPRO) 500 MG tablet Take 1 tablet (500 mg total) by mouth 2 (two) times daily. 12/24/13   Antony MaduraKelly Humes, PA-C  HYDROcodone-acetaminophen (NORCO) 10-325 MG per tablet Take 1 tablet by mouth 3 (three) times daily. 12/24/13   Antony MaduraKelly Humes, PA-C  ketoconazole (NIZORAL) 2 % cream Apply 1 application topically 2 (two) times daily. 01/01/14 01/02/15  Gillian Scarceobyn K Zanard, MD  lisdexamfetamine (VYVANSE) 40 MG capsule Take 1 capsule (40 mg total) by mouth daily as needed (for focus). 01/01/14   Gillian Scarceobyn K Zanard, MD  methocarbamol (ROBAXIN) 750 MG tablet Take 750 mg by mouth 3 (three) times daily as needed for muscle spasms. For back pain    Historical Provider, MD  Multiple Vitamin (MULTIVITAMIN WITH MINERALS) TABS tablet Take 1 tablet by mouth daily.    Historical Provider, MD  ondansetron (ZOFRAN) 4 MG tablet Take 1 tablet (4 mg total) by mouth every 6 (six) hours. 12/24/13   Antony MaduraKelly Humes, PA-C  terbinafine (LAMISIL) 250 MG tablet Take 1 tablet (250 mg total) by mouth daily.  01/01/14 01/02/15  Gillian Scarce, MD  valsartan-hydrochlorothiazide (DIOVAN HCT) 160-25 MG per tablet Take 1 tablet by mouth daily. 01/01/14 01/02/15  Gillian Scarce, MD   BP 121/64  Pulse 115  Temp(Src) 98.3 F (36.8 C) (Oral)  Resp 16  SpO2 100% Physical Exam  Nursing note and vitals reviewed. Constitutional: She is oriented to person, place, and time. She appears well-developed and well-nourished. No distress.  HENT:  Head: Normocephalic and atraumatic.  Mouth/Throat: Oropharynx is clear and moist.  Eyes:  Conjunctivae are normal. Pupils are equal, round, and reactive to light. No scleral icterus.  Neck: Neck supple.  Cardiovascular: Normal rate, regular rhythm, normal heart sounds and intact distal pulses.   No murmur heard. Pulmonary/Chest: Effort normal and breath sounds normal. No stridor. No respiratory distress. She has no rales.  Abdominal: Soft. Bowel sounds are normal. She exhibits no distension. There is tenderness in the right upper quadrant. There is CVA tenderness (right). There is no rigidity, no rebound and no guarding.  Musculoskeletal: Normal range of motion.  Neurological: She is alert and oriented to person, place, and time.  Skin: Skin is warm and dry. No rash noted.  Psychiatric: She has a normal mood and affect. Her behavior is normal.    ED Course  Procedures (including critical care time) Labs Review Labs Reviewed  CBC WITH DIFFERENTIAL - Abnormal; Notable for the following:    WBC 17.7 (*)    Platelets 630 (*)    Neutro Abs 13.4 (*)    All other components within normal limits  COMPREHENSIVE METABOLIC PANEL - Abnormal; Notable for the following:    Sodium 130 (*)    Potassium 5.7 (*)    Chloride 90 (*)    Total Bilirubin 0.2 (*)    All other components within normal limits  URINALYSIS, ROUTINE W REFLEX MICROSCOPIC - Abnormal; Notable for the following:    Leukocytes, UA SMALL (*)    All other components within normal limits  LIPASE, BLOOD - Abnormal; Notable for the following:    Lipase 62 (*)    All other components within normal limits  PREGNANCY, URINE  URINE MICROSCOPIC-ADD ON  CBC  BASIC METABOLIC PANEL    Imaging Review Ct Abdomen Pelvis W Contrast  01/04/2014   CLINICAL DATA:  Low back pain with weakness and fever. Diagnosed with kidney infection treated with antibiotics with limited response.  EXAM: CT ABDOMEN AND PELVIS WITH CONTRAST  TECHNIQUE: Multidetector CT imaging of the abdomen and pelvis was performed using the standard protocol  following bolus administration of intravenous contrast.  CONTRAST:  50mL OMNIPAQUE IOHEXOL 300 MG/ML SOLN, OMNIPAQUE IOHEXOL 300 MG/ML SOLN  COMPARISON:  Prior examinations 04/03/2013 and 12/24/2013.  FINDINGS: The lung bases are clear. There is no pleural or pericardial effusion.  There has been interval improvement in the previously demonstrated asymmetric perinephric soft tissue stranding and ill-defined fluid on the right. Likewise, there is no longer any significant focal abnormality within the right kidney. There is no hydronephrosis or evidence of urinary tract calculus. The left kidney appears normal. The bladder appears normal.  The liver, spleen, gallbladder, pancreas and adrenal glands appear normal.  No significant vascular findings are demonstrated. Specifically, the right renal vein appears normal. The left renal vein is retro aortic.  The stomach, small bowel, appendix and proximal colon appear normal. There is persistent diffuse sigmoid colon wall thickening with suspicion of a small amount of extraluminal air and fluid lateral to the sigmoid colon (images  [id number]). The enteric contrast does not yet passed through this region. No drainable fluid collection, bowel obstruction or free intraperitoneal air is seen.  The uterus and ovaries appear stable. Intrauterine device is present.  There are no worrisome osseous findings.  IMPRESSION: 1. Interval improvement in recently demonstrated changes of right-sided pyelonephritis. No evidence of renal or perinephric abscess. No evidence of ureteral obstruction. 2. Suspicion of recurrent sigmoid colon diverticulitis with small amount of extraluminal air and fluid. No drainable abscess. 3. No evidence of bowel obstruction.   Electronically Signed   By: Roxy Horseman M.D.   On: 01/04/2014 18:39  All radiology studies independently viewed by me.      EKG Interpretation None      MDM   Final diagnoses:  Acute diverticulitis    38 yo female with  fevers and flank pain.  CT shows sigmoid diverticulitis.  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 Maisie Fus at the end of the month).  Pain controlled with IV morphine.  As she just finished a course of cipro for pyelo, plan to treat with Zosyn.  Discussed with Dr. Abbey Chatters, who will admit.      Candyce Churn III, MD 01/04/14 [phone]

## 2014-01-04 NOTE — ED Notes (Signed)
Pt states she was dx w/ kidney infection that grew e. Coli.  States that she finished her abx but feels the exact same.  States that she has been having fever at home of 101.  C/o low back pain and weakness.

## 2014-01-04 NOTE — H&P (Signed)
Brandy Vaughn is an 38 y.o. female.   Chief Complaint:   Lower abdominal pain, fever, chills, nausea HPI:  She had the above symptoms and presented to the emergency department. She had been seen on June 28 and diagnosed with pyelonephritis. She was started on Cipro. Her last dose of Cipro was 3 days ago. While in the emergency department she was noted to have a leukocytosis similar to what she had on June 28. CT scan demonstrated findings consistent with acute sigmoid diverticulitis with small amount of extraluminal air. The changes of right pyelonephritis were improved. She also states that she has been referred to Dr. Marcello Moores of our practice because of a suspicion of a rectovaginal fistula. She states she intermittently has been passing feculent fluid from her vagina and also passing some air from her vagina. This occurs occasionally during a bowel movement.  She has a history of a third degree vaginal tear during the delivery of her first child.  She has a history of previous diverticulitis. Her first episode was in September of 2014 which responded to oral antibiotics. She could not afford a colonoscopy which was recommended by her gastroenterologist, Dr. Earle Gell.  Past Medical History  Diagnosis Date  . Hypertension   . ADD (attention deficit disorder)   . Rosacea     Past Surgical History  Procedure Laterality Date  . Wisdom tooth extraction      Family History  Problem Relation Age of Onset  . Cancer Mother     Colon, Cervical   . Hypertension Father   . Osteoporosis Paternal Grandmother   . Hypertension Paternal Grandmother   . Cancer Paternal Grandfather     Bladder, Throat    Social History:  reports that she has been smoking Cigarettes.  She has been smoking about 0.50 packs per day. She uses smokeless tobacco. She reports that she does not drink alcohol. Her drug history is not on file.  Allergies: No Known Allergies  Prior to Admission medications   Medication Sig  Start Date End Date Taking? Authorizing Provider  acetaminophen (TYLENOL) 325 MG tablet Take 650 mg by mouth every 6 (six) hours as needed (pain).   Yes Historical Provider, MD  HYDROcodone-acetaminophen (NORCO) 10-325 MG per tablet Take 1 tablet by mouth 3 (three) times daily. 12/24/13  Yes Antonietta Breach, PA-C  lisdexamfetamine (VYVANSE) 40 MG capsule Take 1 capsule (40 mg total) by mouth daily as needed (for focus). 01/01/14  Yes Jonathon Resides, MD  methocarbamol (ROBAXIN) 750 MG tablet Take 750 mg by mouth 3 (three) times daily as needed for muscle spasms. For back pain   Yes Historical Provider, MD  Multiple Vitamin (MULTIVITAMIN WITH MINERALS) TABS tablet Take 1 tablet by mouth daily.   Yes Historical Provider, MD  ondansetron (ZOFRAN) 4 MG tablet Take 1 tablet (4 mg total) by mouth every 6 (six) hours. 12/24/13  Yes Antonietta Breach, PA-C  terbinafine (LAMISIL) 250 MG tablet Take 1 tablet (250 mg total) by mouth daily. 01/01/14 01/02/15 Yes Jonathon Resides, MD  valsartan-hydrochlorothiazide (DIOVAN HCT) 160-25 MG per tablet Take 1 tablet by mouth daily. 01/01/14 01/02/15 Yes Jonathon Resides, MD      (Not in a hospital admission)  Results for orders placed during the hospital encounter of 01/04/14 (from the past 48 hour(s))  URINALYSIS, ROUTINE W REFLEX MICROSCOPIC     Status: Abnormal   Collection Time    01/04/14  3:50 PM      Result Value  Ref Range   Color, Urine YELLOW  YELLOW   APPearance CLEAR  CLEAR   Specific Gravity, Urine 1.007  1.005 - 1.030   pH 6.5  5.0 - 8.0   Glucose, UA NEGATIVE  NEGATIVE mg/dL   Hgb urine dipstick NEGATIVE  NEGATIVE   Bilirubin Urine NEGATIVE  NEGATIVE   Ketones, ur NEGATIVE  NEGATIVE mg/dL   Protein, ur NEGATIVE  NEGATIVE mg/dL   Urobilinogen, UA 0.2  0.0 - 1.0 mg/dL   Nitrite NEGATIVE  NEGATIVE   Leukocytes, UA SMALL (*) NEGATIVE  PREGNANCY, URINE     Status: None   Collection Time    01/04/14  3:50 PM      Result Value Ref Range   Preg Test, Ur NEGATIVE   NEGATIVE   Comment:            THE SENSITIVITY OF THIS     METHODOLOGY IS >20 mIU/mL.  URINE MICROSCOPIC-ADD ON     Status: None   Collection Time    01/04/14  3:50 PM      Result Value Ref Range   Squamous Epithelial / LPF RARE  RARE   WBC, UA 0-2  <3 WBC/hpf   Bacteria, UA RARE  RARE  CBC WITH DIFFERENTIAL     Status: Abnormal   Collection Time    01/04/14  4:11 PM      Result Value Ref Range   WBC 17.7 (*) 4.0 - 10.5 K/uL   RBC 4.54  3.87 - 5.11 MIL/uL   Hemoglobin 14.5  12.0 - 15.0 g/dL   HCT 42.6  36.0 - 46.0 %   MCV 93.8  78.0 - 100.0 fL   MCH 31.9  26.0 - 34.0 pg   MCHC 34.0  30.0 - 36.0 g/dL   RDW 12.8  11.5 - 15.5 %   Platelets 630 (*) 150 - 400 K/uL   Neutrophils Relative % 76  43 - 77 %   Neutro Abs 13.4 (*) 1.7 - 7.7 K/uL   Lymphocytes Relative 18  12 - 46 %   Lymphs Abs 3.2  0.7 - 4.0 K/uL   Monocytes Relative 5  3 - 12 %   Monocytes Absolute 0.8  0.1 - 1.0 K/uL   Eosinophils Relative 1  0 - 5 %   Eosinophils Absolute 0.2  0.0 - 0.7 K/uL   Basophils Relative 0  0 - 1 %   Basophils Absolute 0.1  0.0 - 0.1 K/uL  COMPREHENSIVE METABOLIC PANEL     Status: Abnormal   Collection Time    01/04/14  4:11 PM      Result Value Ref Range   Sodium 130 (*) 137 - 147 mEq/L   Potassium 5.7 (*) 3.7 - 5.3 mEq/L   Comment: MODERATE HEMOLYSIS     HEMOLYSIS AT THIS LEVEL MAY AFFECT RESULT   Chloride 90 (*) 96 - 112 mEq/L   CO2 26  19 - 32 mEq/L   Glucose, Bld 83  70 - 99 mg/dL   BUN 20  6 - 23 mg/dL   Creatinine, Ser 0.70  0.50 - 1.10 mg/dL   Calcium 10.1  8.4 - 10.5 mg/dL   Total Protein 8.0  6.0 - 8.3 g/dL   Albumin 4.2  3.5 - 5.2 g/dL   AST 24  0 - 37 U/L   Comment: MODERATE HEMOLYSIS     HEMOLYSIS AT THIS LEVEL MAY AFFECT RESULT   ALT 16  0 - 35 U/L  Comment: MODERATE HEMOLYSIS     HEMOLYSIS AT THIS LEVEL MAY AFFECT RESULT   Alkaline Phosphatase 72  39 - 117 U/L   Comment: MODERATE HEMOLYSIS     HEMOLYSIS AT THIS LEVEL MAY AFFECT RESULT   Total Bilirubin 0.2  (*) 0.3 - 1.2 mg/dL   GFR calc non Af Amer >90  >90 mL/min   GFR calc Af Amer >90  >90 mL/min   Comment: (NOTE)     The eGFR has been calculated using the CKD EPI equation.     This calculation has not been validated in all clinical situations.     eGFR's persistently <90 mL/min signify possible Chronic Kidney     Disease.   Anion gap 14  5 - 15  LIPASE, BLOOD     Status: Abnormal   Collection Time    01/04/14  4:11 PM      Result Value Ref Range   Lipase 62 (*) 11 - 59 U/L   Ct Abdomen Pelvis W Contrast  01/04/2014   CLINICAL DATA:  Low back pain with weakness and fever. Diagnosed with kidney infection treated with antibiotics with limited response.  EXAM: CT ABDOMEN AND PELVIS WITH CONTRAST  TECHNIQUE: Multidetector CT imaging of the abdomen and pelvis was performed using the standard protocol following bolus administration of intravenous contrast.  CONTRAST:  46mL OMNIPAQUE IOHEXOL 300 MG/ML SOLN, 143mL OMNIPAQUE IOHEXOL 300 MG/ML SOLN  COMPARISON:  Prior examinations 04/03/2013 and 12/24/2013.  FINDINGS: The lung bases are clear. There is no pleural or pericardial effusion.  There has been interval improvement in the previously demonstrated asymmetric perinephric soft tissue stranding and ill-defined fluid on the right. Likewise, there is no longer any significant focal abnormality within the right kidney. There is no hydronephrosis or evidence of urinary tract calculus. The left kidney appears normal. The bladder appears normal.  The liver, spleen, gallbladder, pancreas and adrenal glands appear normal.  No significant vascular findings are demonstrated. Specifically, the right renal vein appears normal. The left renal vein is retro aortic.  The stomach, small bowel, appendix and proximal colon appear normal. There is persistent diffuse sigmoid colon wall thickening with suspicion of a small amount of extraluminal air and fluid lateral to the sigmoid colon (images [id number]). The enteric contrast  does not yet passed through this region. No drainable fluid collection, bowel obstruction or free intraperitoneal air is seen.  The uterus and ovaries appear stable. Intrauterine device is present.  There are no worrisome osseous findings.  IMPRESSION: 1. Interval improvement in recently demonstrated changes of right-sided pyelonephritis. No evidence of renal or perinephric abscess. No evidence of ureteral obstruction. 2. Suspicion of recurrent sigmoid colon diverticulitis with small amount of extraluminal air and fluid. No drainable abscess. 3. No evidence of bowel obstruction.   Electronically Signed   By: Camie Patience M.D.   On: 01/04/2014 18:39    Review of Systems  Constitutional: Positive for fever, chills and weight loss.       Poor appetite.  Respiratory: Negative.   Cardiovascular: Negative.   Gastrointestinal: Positive for nausea and abdominal pain. Negative for blood in stool.  Genitourinary: Positive for flank pain. Negative for dysuria and hematuria.  Musculoskeletal: Positive for back pain.  Neurological: Positive for dizziness.  Endo/Heme/Allergies: Does not bruise/bleed easily.    Blood pressure 120/69, pulse 78, temperature 98.2 F (36.8 C), temperature source Oral, resp. rate 16, SpO2 99.00%. Physical Exam  Constitutional: She appears well-developed and well-nourished. No distress.  HENT:  Head: Normocephalic and atraumatic.  Eyes: EOM are normal. No scleral icterus.  Cardiovascular: Normal rate and regular rhythm.   Respiratory: Effort normal and breath sounds normal.  GI: Soft. She exhibits no distension and no mass. There is tenderness (left suprapubic region.). There is no guarding.  Genitourinary: Vaginal discharge (Yellow vaginal discharge on manual vaginal exam. Slight irregularity noted in the distal vagina posteriorly. No feculent discharge.) found.  Musculoskeletal: She exhibits no edema.  Neurological: She is alert.  Skin: Skin is warm and dry.  Psychiatric:  She has a normal mood and affect. Her behavior is normal.     Assessment/Plan 1. Acute recurrent sigmoid diverticulitis.  She was in the emergency department on June 20 and treated for acute pyelonephritis. In reviewing the CT scan, changes of diverticulitis were noted at that time.  However, these were felt to be chronic rather than acute.  2. Pyelonephritis-this appears to be cleared.  3. Rectovaginal fistula by history. She has been referred to Dr. Marcello Moores and has an appointment to be seen later this month.  Plan:  Admit to the hospital. IV antibiotics. If she responds  can switch to oral antibiotics. She can keep her appointment with Dr. Marcello Moores to be evaluated for both the recurrent sigmoid diverticulitis and the possibility of a rectal or colovaginal fistula.  Arash Karstens J 01/04/2014, 8:51 PM

## 2014-01-05 ENCOUNTER — Encounter (HOSPITAL_COMMUNITY): Payer: Self-pay | Admitting: General Surgery

## 2014-01-05 LAB — CBC
HCT: 37.6 % (ref 36.0–46.0)
Hemoglobin: 12.5 g/dL (ref 12.0–15.0)
MCH: 32 pg (ref 26.0–34.0)
MCHC: 33.2 g/dL (ref 30.0–36.0)
MCV: 96.2 fL (ref 78.0–100.0)
PLATELETS: 472 10*3/uL — AB (ref 150–400)
RBC: 3.91 MIL/uL (ref 3.87–5.11)
RDW: 12.9 % (ref 11.5–15.5)
WBC: 11.5 10*3/uL — ABNORMAL HIGH (ref 4.0–10.5)

## 2014-01-05 LAB — BASIC METABOLIC PANEL
ANION GAP: 12 (ref 5–15)
BUN: 12 mg/dL (ref 6–23)
CALCIUM: 9 mg/dL (ref 8.4–10.5)
CO2: 25 mEq/L (ref 19–32)
Chloride: 100 mEq/L (ref 96–112)
Creatinine, Ser: 0.65 mg/dL (ref 0.50–1.10)
GFR calc Af Amer: >90 mL/min (ref >90)
GLUCOSE: 113 mg/dL — AB (ref 70–99)
Potassium: 4 mEq/L (ref 3.7–5.3)
SODIUM: 137 meq/L (ref 137–147)

## 2014-01-05 MED ORDER — METHOCARBAMOL 500 MG PO TABS
500.0000 mg | ORAL_TABLET | Freq: Four times a day (QID) | ORAL | Status: DC | PRN
  Administered 2014-01-05 – 2014-01-06 (×2): 500 mg via ORAL
  Administered 2014-01-06 – 2014-01-07 (×2): 750 mg via ORAL
  Administered 2014-01-08 – 2014-01-09 (×2): 1000 mg via ORAL
  Filled 2014-01-05 (×2): qty 1
  Filled 2014-01-05 (×4): qty 2

## 2014-01-05 MED ORDER — ALUM & MAG HYDROXIDE-SIMETH 200-200-20 MG/5ML PO SUSP: 30.0000 mL | Freq: Four times a day (QID) | ORAL | Status: DC | PRN

## 2014-01-05 MED ORDER — ADULT MULTIVITAMIN W/MINERALS CH
1.0000 | ORAL_TABLET | Freq: Every day | ORAL | Status: DC
  Administered 2014-01-05 – 2014-01-10 (×6): 1 via ORAL
  Filled 2014-01-05 (×6): qty 1

## 2014-01-05 MED ORDER — ACETAMINOPHEN 325 MG PO TABS: 650.0000 mg | ORAL_TABLET | Freq: Four times a day (QID) | ORAL | Status: DC | PRN

## 2014-01-05 MED ORDER — PHENOL 1.4 % MT LIQD
2.0000 | OROMUCOSAL | Status: DC | PRN
Start: 2014-01-05 — End: 2014-01-10

## 2014-01-05 MED ORDER — DEXTROSE 5 % IV SOLN
2.0000 g | Freq: Every day | INTRAVENOUS | Status: DC
  Administered 2014-01-05 – 2014-01-10 (×6): 2 g via INTRAVENOUS
  Filled 2014-01-05 (×6): qty 2

## 2014-01-05 MED ORDER — MENTHOL 3 MG MT LOZG: 1.0000 | LOZENGE | OROMUCOSAL | Status: DC | PRN

## 2014-01-05 MED ORDER — LIP MEDEX EX OINT
1.0000 "application " | TOPICAL_OINTMENT | Freq: Two times a day (BID) | CUTANEOUS | Status: DC
  Administered 2014-01-05 – 2014-01-10 (×10): 1 via TOPICAL
  Filled 2014-01-05: qty 7

## 2014-01-05 MED ORDER — NICOTINE 14 MG/24HR TD PT24
14.0000 mg | MEDICATED_PATCH | Freq: Every day | TRANSDERMAL | Status: DC
  Administered 2014-01-05 – 2014-01-10 (×6): 14 mg via TRANSDERMAL
  Filled 2014-01-05 (×6): qty 1

## 2014-01-05 MED ORDER — LACTATED RINGERS IV BOLUS (SEPSIS): 1000.0000 mL | Freq: Three times a day (TID) | INTRAVENOUS | Status: AC | PRN

## 2014-01-05 MED ORDER — METRONIDAZOLE IN NACL 5-0.79 MG/ML-% IV SOLN
500.0000 mg | Freq: Four times a day (QID) | INTRAVENOUS | Status: DC
  Administered 2014-01-05 – 2014-01-10 (×21): 500 mg via INTRAVENOUS
  Filled 2014-01-05 (×24): qty 100

## 2014-01-05 MED ORDER — MAGIC MOUTHWASH: 15.0000 mL | Freq: Four times a day (QID) | ORAL | Status: DC | PRN

## 2014-01-05 MED ORDER — SACCHAROMYCES BOULARDII 250 MG PO CAPS
250.0000 mg | ORAL_CAPSULE | Freq: Two times a day (BID) | ORAL | Status: DC
  Administered 2014-01-05 – 2014-01-10 (×10): 250 mg via ORAL
  Filled 2014-01-05 (×11): qty 1

## 2014-01-05 NOTE — Progress Notes (Addendum)
Subjective: Still having some pain but not as bad.  A little dizzy at times.  Has right lower back pain-throbbing at times.  Objective: Vital signs in last 24 hours: Temp:  [97.6 F (36.4 C)-98.3 F (36.8 C)] 97.6 F (36.4 C) (07/10 0548) Pulse Rate:  [57-115] 57 (07/10 0548) Resp:  [16-18] 16 (07/10 0548) BP: (95-123)/(60-69) 100/62 mmHg (07/10 0942) SpO2:  [98 %-100 %] 99 % (07/10 0548) Weight:  [151 lb (68.493 kg)] 151 lb (68.493 kg) (07/09 2100)    Intake/Output from previous day: 07/09 0701 - 07/10 0700 In: 940 [I.V.:940] Out: 900 [Urine:900] Intake/Output this shift: Total I/O In: 120 [P.O.:120] Out: 700 [Urine:700]  PE: General- In NAD Abdomen-soft, non-tender, no right flank tenderness  Lab Results:   Recent Labs  01/04/14 1611 01/05/14 0524  WBC 17.7* 11.5*  HGB 14.5 12.5  HCT 42.6 37.6  PLT 630* 472*   BMET  Recent Labs  01/04/14 1611 01/05/14 0524  NA 130* 137  K 5.7* 4.0  CL 90* 100  CO2 26 25  GLUCOSE 83 113*  BUN 20 12  CREATININE 0.70 0.65  CALCIUM 10.1 9.0   PT/INR No results found for this basename: LABPROT, INR,  in the last 72 hours Comprehensive Metabolic Panel:    Component Value Date/Time   NA 137 01/05/2014 0524   NA 130* 01/04/2014 1611   K 4.0 01/05/2014 0524   K 5.7* 01/04/2014 1611   CL 100 01/05/2014 0524   CL 90* 01/04/2014 1611   CO2 25 01/05/2014 0524   CO2 26 01/04/2014 1611   BUN 12 01/05/2014 0524   BUN 20 01/04/2014 1611   CREATININE 0.65 01/05/2014 0524   CREATININE 0.70 01/04/2014 1611   GLUCOSE 113* 01/05/2014 0524   GLUCOSE 83 01/04/2014 1611   CALCIUM 9.0 01/05/2014 0524   CALCIUM 10.1 01/04/2014 1611   AST 24 01/04/2014 1611   AST 21 12/23/2013 2316   ALT 16 01/04/2014 1611   ALT 19 12/23/2013 2316   ALKPHOS 72 01/04/2014 1611   ALKPHOS 74 12/23/2013 2316   BILITOT 0.2* 01/04/2014 1611   BILITOT 0.3 12/23/2013 2316   PROT 8.0 01/04/2014 1611   PROT 7.7 12/23/2013 2316   ALBUMIN 4.2 01/04/2014 1611   ALBUMIN 3.6 12/23/2013 2316      Studies/Results: Ct Abdomen Pelvis W Contrast  01/04/2014   CLINICAL DATA:  Low back pain with weakness and fever. Diagnosed with kidney infection treated with antibiotics with limited response.  EXAM: CT ABDOMEN AND PELVIS WITH CONTRAST  TECHNIQUE: Multidetector CT imaging of the abdomen and pelvis was performed using the standard protocol following bolus administration of intravenous contrast.  CONTRAST:  50mL OMNIPAQUE IOHEXOL 300 MG/ML SOLN, 100mL OMNIPAQUE IOHEXOL 300 MG/ML SOLN  COMPARISON:  Prior examinations 04/03/2013 and 12/24/2013.  FINDINGS: The lung bases are clear. There is no pleural or pericardial effusion.  There has been interval improvement in the previously demonstrated asymmetric perinephric soft tissue stranding and ill-defined fluid on the right. Likewise, there is no longer any significant focal abnormality within the right kidney. There is no hydronephrosis or evidence of urinary tract calculus. The left kidney appears normal. The bladder appears normal.  The liver, spleen, gallbladder, pancreas and adrenal glands appear normal.  No significant vascular findings are demonstrated. Specifically, the right renal vein appears normal. The left renal vein is retro aortic.  The stomach, small bowel, appendix and proximal colon appear normal. There is persistent diffuse sigmoid colon wall thickening with suspicion of  a small amount of extraluminal air and fluid lateral to the sigmoid colon (images [id number]). The enteric contrast does not yet passed through this region. No drainable fluid collection, bowel obstruction or free intraperitoneal air is seen.  The uterus and ovaries appear stable. Intrauterine device is present.  There are no worrisome osseous findings.  IMPRESSION: 1. Interval improvement in recently demonstrated changes of right-sided pyelonephritis. No evidence of renal or perinephric abscess. No evidence of ureteral obstruction. 2. Suspicion of recurrent sigmoid colon  diverticulitis with small amount of extraluminal air and fluid. No drainable abscess. 3. No evidence of bowel obstruction.   Electronically Signed   By: Roxy Horseman M.D.   On: 01/04/2014 18:39    Anti-infectives: Anti-infectives   Start     Dose/Rate Route Frequency Ordered Stop   01/05/14 0715  cefTRIAXone (ROCEPHIN) 2 g in dextrose 5 % 50 mL IVPB    Comments:  Pharmacy may adjust dosing strength / duration / interval for maximal efficacy   2 g 100 mL/hr over 30 Minutes Intravenous Daily with breakfast 01/05/14 0702     01/05/14 0715  metroNIDAZOLE (FLAGYL) IVPB 500 mg     500 mg 100 mL/hr over 60 Minutes Intravenous 4 times per day 01/05/14 0702     01/05/14 0600  piperacillin-tazobactam (ZOSYN) IVPB 3.375 g  Status:  Discontinued     3.375 g 12.5 mL/hr over 240 Minutes Intravenous 3 times per day 01/04/14 2051 01/05/14 0702   01/04/14 2200  terbinafine (LAMISIL) tablet 250 mg     250 mg Oral Daily 01/04/14 2051     01/04/14 2015  piperacillin-tazobactam (ZOSYN) IVPB 3.375 g     3.375 g 100 mL/hr over 30 Minutes Intravenous  Once 01/04/14 2000 01/04/14 2056   01/04/14 2000  piperacillin-tazobactam (ZOSYN) IVPB 3.375 g  Status:  Discontinued     3.375 g 12.5 mL/hr over 240 Minutes Intravenous  Once 01/04/14 1956 01/04/14 2000      Assessment Principal Problem:   Acute diverticulitis-clinically improved.   Right lower back pain-urine is clean; may be musculoskeletal   HTN-BP within normal limits off home med    LOS: 1 day   Plan: Continue IV abxs, full liquids, Robaxin for back pain.  Restart home antihypertensives.   Abdulahi Schor J 01/05/2014

## 2014-01-06 LAB — CBC
HCT: 34.7 % — ABNORMAL LOW (ref 36.0–46.0)
Hemoglobin: 11.6 g/dL — ABNORMAL LOW (ref 12.0–15.0)
MCH: 32 pg (ref 26.0–34.0)
MCHC: 33.4 g/dL (ref 30.0–36.0)
MCV: 95.9 fL (ref 78.0–100.0)
PLATELETS: 440 10*3/uL — AB (ref 150–400)
RBC: 3.62 MIL/uL — ABNORMAL LOW (ref 3.87–5.11)
RDW: 12.7 % (ref 11.5–15.5)
WBC: 14.6 10*3/uL — ABNORMAL HIGH (ref 4.0–10.5)

## 2014-01-06 NOTE — Progress Notes (Signed)
Patient ID: Brandy Vaughn, female   DOB: June 18, 1976, 38 y.o.   MRN: [id number]    Subjective: Still with some back pain and intermittent left lower quadrant abdominal pain. Had a couple of small bowel movements. Has noticed a little bit of feculent vaginal drainage as she has had. Overall does not feel worse or better.  Objective: Vital signs in last 24 hours: Temp:  [97.9 F (36.6 C)-98.2 F (36.8 C)] 98.1 F (36.7 C) (07/11 0955) Pulse Rate:  [53-75] 53 (07/11 0955) Resp:  [16-18] 18 (07/11 0955) BP: (102-113)/(55-74) 105/69 mmHg (07/11 0955) SpO2:  [96 %-100 %] 96 % (07/11 0955) Last BM Date: 01/04/14  Intake/Output from previous day: 07/10 0701 - 07/11 0700 In: 2989.2 [P.O.:720; I.V.:2169.2; IV Piggyback:100] Out: 3150 [Urine:3150] Intake/Output this shift: Total I/O In: 240 [P.O.:240] Out: -   General appearance: alert, cooperative and no distress GI: abnormal findings:  moderate tenderness in the LLQ  Lab Results:   Recent Labs  01/05/14 0524 01/06/14 0015  WBC 11.5* 14.6*  HGB 12.5 11.6*  HCT 37.6 34.7*  PLT 472* 440*   BMET  Recent Labs  01/04/14 1611 01/05/14 0524  NA 130* 137  K 5.7* 4.0  CL 90* 100  CO2 26 25  GLUCOSE 83 113*  BUN 20 12  CREATININE 0.70 0.65  CALCIUM 10.1 9.0     Studies/Results: Ct Abdomen Pelvis W Contrast  01/04/2014   CLINICAL DATA:  Low back pain with weakness and fever. Diagnosed with kidney infection treated with antibiotics with limited response.  EXAM: CT ABDOMEN AND PELVIS WITH CONTRAST  TECHNIQUE: Multidetector CT imaging of the abdomen and pelvis was performed using the standard protocol following bolus administration of intravenous contrast.  CONTRAST:  50mL OMNIPAQUE IOHEXOL 300 MG/ML SOLN, OMNIPAQUE IOHEXOL 300 MG/ML SOLN  COMPARISON:  Prior examinations 04/03/2013 and 12/24/2013.  FINDINGS: The lung bases are clear. There is no pleural or pericardial effusion.  There has been interval improvement in the  previously demonstrated asymmetric perinephric soft tissue stranding and ill-defined fluid on the right. Likewise, there is no longer any significant focal abnormality within the right kidney. There is no hydronephrosis or evidence of urinary tract calculus. The left kidney appears normal. The bladder appears normal.  The liver, spleen, gallbladder, pancreas and adrenal glands appear normal.  No significant vascular findings are demonstrated. Specifically, the right renal vein appears normal. The left renal vein is retro aortic.  The stomach, small bowel, appendix and proximal colon appear normal. There is persistent diffuse sigmoid colon wall thickening with suspicion of a small amount of extraluminal air and fluid lateral to the sigmoid colon (images 62-66). The enteric contrast does not yet passed through this region. No drainable fluid collection, bowel obstruction or free intraperitoneal air is seen.  The uterus and ovaries appear stable. Intrauterine device is present.  There are no worrisome osseous findings.  IMPRESSION: 1. Interval improvement in recently demonstrated changes of right-sided pyelonephritis. No evidence of renal or perinephric abscess. No evidence of ureteral obstruction. 2. Suspicion of recurrent sigmoid colon diverticulitis with small amount of extraluminal air and fluid. No drainable abscess. 3. No evidence of bowel obstruction.   Electronically Signed   By: Roxy Horseman M.D.   On: 01/04/2014 18:39    Anti-infectives: Anti-infectives   Start     Dose/Rate Route Frequency Ordered Stop   01/05/14 0715  cefTRIAXone (ROCEPHIN) 2 g in dextrose 5 % 50 mL IVPB    Comments:  Pharmacy may  adjust dosing strength / duration / interval for maximal efficacy   2 g 100 mL/hr over 30 Minutes Intravenous Daily with breakfast 01/05/14 0702     01/05/14 0715  metroNIDAZOLE (FLAGYL) IVPB 500 mg     500 mg 100 mL/hr over 60 Minutes Intravenous 4 times per day 01/05/14 0702     01/05/14 0600   piperacillin-tazobactam (ZOSYN) IVPB 3.375 g  Status:  Discontinued     3.375 g 12.5 mL/hr over 240 Minutes Intravenous 3 times per day 01/04/14 2051 01/05/14 0702   01/04/14 2200  terbinafine (LAMISIL) tablet 250 mg     250 mg Oral Daily 01/04/14 2051     01/04/14 2015  piperacillin-tazobactam (ZOSYN) IVPB 3.375 g     3.375 g 100 mL/hr over 30 Minutes Intravenous  Once 01/04/14 2000 01/04/14 2056   01/04/14 2000  piperacillin-tazobactam (ZOSYN) IVPB 3.375 g  Status:  Discontinued     3.375 g 12.5 mL/hr over 240 Minutes Intravenous  Once 01/04/14 1956 01/04/14 2000      Assessment/Plan: 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 Has some persistent pain and tenderness and white blood count is up slightly today compared to yesterday. No generalized pain or tenderness. Continue IV antibiotics and observation.    LOS: 2 days    [name] T 01/06/2014

## 2014-01-07 LAB — CBC
HCT: 38.4 % (ref 36.0–46.0)
HEMOGLOBIN: 12.2 g/dL (ref 12.0–15.0)
MCH: 31 pg (ref 26.0–34.0)
MCHC: 31.8 g/dL (ref 30.0–36.0)
MCV: 97.7 fL (ref 78.0–100.0)
Platelets: 389 10*3/uL (ref 150–400)
RBC: 3.93 MIL/uL (ref 3.87–5.11)
RDW: 12.6 % (ref 11.5–15.5)
WBC: 9.4 10*3/uL (ref 4.0–10.5)

## 2014-01-07 MED ORDER — HYDROCODONE-ACETAMINOPHEN 10-325 MG PO TABS
1.0000 | ORAL_TABLET | Freq: Three times a day (TID) | ORAL | Status: DC
  Administered 2014-01-07 – 2014-01-10 (×11): 1 via ORAL
  Filled 2014-01-07 (×11): qty 1

## 2014-01-07 NOTE — Progress Notes (Signed)
General Surgery Note  LOS: 3 days  POD -    PCP - [facility] - [facility]  Assessment/Plan: 1.  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  2.  DVT prophylaxis - Lovenox 3.  HTN 4.  ADD 5.  History of pyelonephritis 6.  Smokes - on nicoderm  She says that she has quit with this illness. 7.  Chronic back pain  Sees Dr. Dorna BloomWu 8.  On chronic pain meds - she takes 10 mg Norco TID (has done this for 4 years) - Sees Dr. Eather ColasHeag   Principal Problem:   Acute diverticulitis  Subjective:  Back pain worse than abdominal pain. She is taking a lot of morphine. She is off her Norco (she has been taking 10 mg TID x 4 years). Objective:   Filed Vitals:   01/07/14 0520  BP: 97/62  Pulse: 67  Temp: 98 F (36.7 C)  Resp: 16     Intake/Output from previous day:  07/11 0701 - 07/12 0700 In: 3328.8 [P.O.:720; I.V.:1808.8; IV Piggyback:800] Out: -   Intake/Output this shift:      Physical Exam:   General: WN WF who is alert and oriented.    HEENT: Normal. Pupils equal. .   Lungs: Clear.   Abdomen: Soft.  Minimal LLQ discomfort.     Lab Results:    Recent Labs  01/06/14 0015 01/07/14 0604  WBC 14.6* 9.4  HGB 11.6* 12.2  HCT 34.7* 38.4  PLT 440* 389    BMET   Recent Labs  01/04/14 1611 01/05/14 0524  NA 130* 137  K 5.7* 4.0  CL 90* 100  CO2 26 25  GLUCOSE 83 113*  BUN 20 12  CREATININE 0.70 0.65  CALCIUM 10.1 9.0    PT/INR  No results found for this basename: LABPROT, INR,  in the last 72 hours  ABG  No results found for this basename: PHART, PCO2, PO2, HCO3,  in the last 72 hours   Studies/Results:  No results found.   Anti-infectives:   Anti-infectives   Start     Dose/Rate Route Frequency Ordered Stop   01/05/14 0715   cefTRIAXone (ROCEPHIN) 2 g in dextrose 5 % 50 mL IVPB    Comments:  Pharmacy may adjust dosing strength / duration / interval for maximal efficacy   2 g 100 mL/hr over 30 Minutes Intravenous Daily with breakfast 01/05/14 0702     01/05/14 0715  metroNIDAZOLE (FLAGYL) IVPB 500 mg     500 mg 100 mL/hr over 60 Minutes Intravenous 4 times per day 01/05/14 0702     01/05/14 0600  piperacillin-tazobactam (ZOSYN) IVPB 3.375 g  Status:  Discontinued     3.375 g 12.5 mL/hr over 240 Minutes Intravenous 3 times per day 01/04/14 2051 01/05/14 0702   01/04/14 2200  terbinafine (LAMISIL) tablet 250 mg     250 mg Oral Daily 01/04/14 2051     01/04/14 2015  piperacillin-tazobactam (ZOSYN) IVPB 3.375 g     3.375 g 100 mL/hr over 30 Minutes Intravenous  Once 01/04/14 2000 01/04/14 2056   01/04/14 2000  piperacillin-tazobactam (ZOSYN) IVPB 3.375 g  Status:  Discontinued     3.375 g 12.5 mL/hr over 240 Minutes Intravenous  Once  01/04/14 1956 01/04/14 2000      Ovidio Kin, MD, FACS Pager: [phone] [facility] Office: [phone] 01/07/2014

## 2014-01-08 LAB — CBC WITH DIFFERENTIAL/PLATELET
Basophils Absolute: 0.1 10*3/uL (ref 0.0–0.1)
Basophils Relative: 1 % (ref 0–1)
EOS ABS: 0.2 10*3/uL (ref 0.0–0.7)
Eosinophils Relative: 3 % (ref 0–5)
HCT: 35.8 % — ABNORMAL LOW (ref 36.0–46.0)
HEMOGLOBIN: 11.7 g/dL — AB (ref 12.0–15.0)
LYMPHS ABS: 2.9 10*3/uL (ref 0.7–4.0)
LYMPHS PCT: 34 % (ref 12–46)
MCH: 31.7 pg (ref 26.0–34.0)
MCHC: 32.7 g/dL (ref 30.0–36.0)
MCV: 97 fL (ref 78.0–100.0)
MONOS PCT: 7 % (ref 3–12)
Monocytes Absolute: 0.6 10*3/uL (ref 0.1–1.0)
Neutro Abs: 4.8 10*3/uL (ref 1.7–7.7)
Neutrophils Relative %: 55 % (ref 43–77)
PLATELETS: 371 10*3/uL (ref 150–400)
RBC: 3.69 MIL/uL — AB (ref 3.87–5.11)
RDW: 12.5 % (ref 11.5–15.5)
WBC: 8.6 10*3/uL (ref 4.0–10.5)

## 2014-01-08 MED ORDER — OXYCODONE HCL 5 MG PO TABS
5.0000 mg | ORAL_TABLET | ORAL | Status: DC | PRN
  Administered 2014-01-08 – 2014-01-10 (×11): 10 mg via ORAL
  Filled 2014-01-08 (×11): qty 2

## 2014-01-08 NOTE — Progress Notes (Signed)
Patient interviewed and examined, agree with PA note above.  Mariella SaaBenjamin T Vinton Layson MD, FACS  01/08/2014 10:43 AM

## 2014-01-08 NOTE — Progress Notes (Signed)
[facility] Progress Note     Subjective: Pt doing well, tolerating full liquids, wants soft food.  Having BM's and urinating well.  Had a small amount of gas and drainage per vagina yesterday and today, but much less.  Pain much improved overall.  Ambulating some OOB.    Objective: Vital signs in last 24 hours: Temp:  [97.7 F (36.5 C)-98.4 F (36.9 C)] 97.7 F (36.5 C) (07/13 0500) Pulse Rate:  [54-57] 54 (07/13 0500) Resp:  [16-18] 16 (07/13 0500) BP: (101-116)/(53-63) 113/53 mmHg (07/13 0500) SpO2:  [98 %-100 %] 99 % (07/13 0500) Last BM Date: 01/07/14  Intake/Output from previous day: 07/12 0701 - 07/13 0700 In: 2876.3 [P.O.:600; I.V.:1826.3; IV Piggyback:450] Out: 2450 [Urine:2450] Intake/Output this shift:    PE: Gen:  Alert, NAD, pleasant Abd: Soft, minimally tender in middle of abdomen, ND, +BS   Lab Results:   Recent Labs  01/07/14 0604 01/08/14 0519  WBC 9.4 8.6  HGB 12.2 11.7*  HCT 38.4 35.8*  PLT 389 371   BMET No results found for this basename: NA, K, CL, CO2, GLUCOSE, BUN, CREATININE, CALCIUM,  in the last 72 hours PT/INR No results found for this basename: LABPROT, INR,  in the last 72 hours CMP     Component Value Date/Time   NA 137 01/05/2014 0524   K 4.0 01/05/2014 0524   CL 100 01/05/2014 0524   CO2 25 01/05/2014 0524   GLUCOSE 113* 01/05/2014 0524   BUN 12 01/05/2014 0524   CREATININE 0.65 01/05/2014 0524   CALCIUM 9.0 01/05/2014 0524   PROT 8.0 01/04/2014 1611   ALBUMIN 4.2 01/04/2014 1611   AST 24 01/04/2014 1611   ALT 16 01/04/2014 1611   ALKPHOS 72 01/04/2014 1611   BILITOT 0.2* 01/04/2014 1611   GFRNONAA >90 01/05/2014 0524   GFRAA >90 01/05/2014 0524   Lipase     Component Value Date/Time   LIPASE 62* 01/04/2014 1611       Studies/Results: No results found.  Anti-infectives: Anti-infectives   Start     Dose/Rate Route Frequency Ordered Stop   01/05/14 0715  cefTRIAXone (ROCEPHIN) 2 g in dextrose 5 % 50 mL IVPB     Comments:  Pharmacy may adjust dosing strength / duration / interval for maximal efficacy   2 g 100 mL/hr over 30 Minutes Intravenous Daily with breakfast 01/05/14 0702     01/05/14 0715  metroNIDAZOLE (FLAGYL) IVPB 500 mg     500 mg 100 mL/hr over 60 Minutes Intravenous 4 times per day 01/05/14 0702     01/05/14 0600  piperacillin-tazobactam (ZOSYN) IVPB 3.375 g  Status:  Discontinued     3.375 g 12.5 mL/hr over 240 Minutes Intravenous 3 times per day 01/04/14 2051 01/05/14 0702   01/04/14 2200  terbinafine (LAMISIL) tablet 250 mg     250 mg Oral Daily 01/04/14 2051     01/04/14 2015  piperacillin-tazobactam (ZOSYN) IVPB 3.375 g     3.375 g 100 mL/hr over 30 Minutes Intravenous  Once 01/04/14 2000 01/04/14 2056   01/04/14 2000  piperacillin-tazobactam (ZOSYN) IVPB 3.375 g  Status:  Discontinued     3.375 g 12.5 mL/hr over 240 Minutes Intravenous  Once 01/04/14 1956 01/04/14 2000       Assessment/Plan 1. 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 Maisie Fushomas on 01/23/14  Will likely need antibiotics until her appt  2. DVT prophylaxis - Lovenox  3. HTN  4. ADD  5. History of pyelonephritis  6. Smokes - on nicoderm - She says that she has quit with this illness.  7. Chronic back pain - Sees Dr. Dorna BloomWu  8. On chronic pain meds - she takes 10 mg Norco TID (has done this for 4 years) - Sees Dr. Eather ColasHeag     LOS: 4 days    DORT, [facility] 01/08/2014, 7:44 AM Pager: [phone]

## 2014-01-08 NOTE — Care Management Note (Signed)
    Page 1 of 1   01/08/2014     11:01:54 AM CARE MANAGEMENT NOTE 01/08/2014  Patient:  Brandy Vaughn,Brandy Vaughn   Account Number:  [id number]  Date Initiated:  01/08/2014  Documentation initiated by:  Lorenda IshiharaPEELE,Laurina Fischl  Subjective/Objective Assessment:   38 yo female admitted with diverticulitis, possible fistula. PTA lived at home with spouse.     Action/Plan:   Home when stable   Anticipated DC Date:  01/09/2014   Anticipated DC Plan:  HOME/SELF CARE      DC Planning Services  CM consult      Choice offered to / List presented to:             Status of service:  Completed, signed off Medicare Important Message given?  NA - LOS <3 / Initial given by admissions (If response is "NO", the following Medicare IM given date fields will be blank) Date Medicare IM given:   Medicare IM given by:   Date Additional Medicare IM given:   Additional Medicare IM given by:    Discharge Disposition:  HOME/SELF CARE  Per UR Regulation:  Reviewed for med. necessity/level of care/duration of stay  If discussed at Long Length of Stay Meetings, dates discussed:    Comments:

## 2014-01-09 ENCOUNTER — Encounter (HOSPITAL_COMMUNITY): Payer: Self-pay | Admitting: *Deleted

## 2014-01-09 DIAGNOSIS — N824 Other female intestinal-genital tract fistulae: Secondary | ICD-10-CM

## 2014-01-09 LAB — CBC
HEMATOCRIT: 38.1 % (ref 36.0–46.0)
Hemoglobin: 12.6 g/dL (ref 12.0–15.0)
MCH: 31.5 pg (ref 26.0–34.0)
MCHC: 33.1 g/dL (ref 30.0–36.0)
MCV: 95.3 fL (ref 78.0–100.0)
PLATELETS: 402 10*3/uL — AB (ref 150–400)
RBC: 4 MIL/uL (ref 3.87–5.11)
RDW: 12.3 % (ref 11.5–15.5)
WBC: 6.3 10*3/uL (ref 4.0–10.5)

## 2014-01-09 LAB — BASIC METABOLIC PANEL
Anion gap: 11 (ref 5–15)
BUN: 7 mg/dL (ref 6–23)
CALCIUM: 9.5 mg/dL (ref 8.4–10.5)
CHLORIDE: 101 meq/L (ref 96–112)
CO2: 28 mEq/L (ref 19–32)
CREATININE: 0.68 mg/dL (ref 0.50–1.10)
GFR calc Af Amer: >90 mL/min (ref >90)
GFR calc non Af Amer: >90 mL/min (ref >90)
Glucose, Bld: 109 mg/dL — ABNORMAL HIGH (ref 70–99)
Potassium: 4.4 mEq/L (ref 3.7–5.3)
Sodium: 140 mEq/L (ref 137–147)

## 2014-01-09 NOTE — Progress Notes (Signed)
Pt c/o insomnia. Called md on call awaiting call back.

## 2014-01-09 NOTE — Progress Notes (Signed)
Patient interviewed and examined, agree with PA note above. She has had increasing lower abdominal pain last night and today. She however does not appear ill and is up and ambulatory without difficulty. There is moderate left lower quadrant tenderness. Continue antibiotics and observation for now. Consider repeat CT tomorrow if no improvement.  Brandy SaaBenjamin T Toren Tucholski MD, FACS  01/09/2014 6:45 PM

## 2014-01-09 NOTE — Progress Notes (Signed)
[facility] Progress Note     Subjective: Pt feels worse yesterday and today.  Increased pain in LLQ/suprapubic which radiates to b/l flanks.  Pt denies N/V.  Notes increased vaginal feculent discharge and gas per vagina when she had a few BM's yesterday and today.    Objective: Vital signs in last 24 hours: Temp:  [97.8 F (36.6 C)-98.4 F (36.9 C)] 97.8 F (36.6 C) (07/14 0630) Pulse Rate:  [57-68] 57 (07/14 0630) Resp:  [18] 18 (07/14 0630) BP: (101-130)/(65-80) 101/65 mmHg (07/14 0630) SpO2:  [97 %-100 %] 99 % (07/14 0630) Last BM Date: 01/08/14  Intake/Output from previous day: 07/13 0701 - 07/14 0700 In: 2470 [P.O.:1320; I.V.:900; IV Piggyback:250] Out: 3300 [Urine:3300] Intake/Output this shift: Total I/O In: 350 [I.V.:300; IV Piggyback:50] Out: -   PE: Gen:  Alert, NAD, pleasant Abd: Soft, tender in RUQ, LLQ and suprapubic region, +BS, no HSM, no peritoneal signs  Lab Results:   Recent Labs  01/07/14 0604 01/08/14 0519  WBC 9.4 8.6  HGB 12.2 11.7*  HCT 38.4 35.8*  PLT 389 371   BMET No results found for this basename: NA, K, CL, CO2, GLUCOSE, BUN, CREATININE, CALCIUM,  in the last 72 hours PT/INR No results found for this basename: LABPROT, INR,  in the last 72 hours CMP     Component Value Date/Time   NA 137 01/05/2014 0524   K 4.0 01/05/2014 0524   CL 100 01/05/2014 0524   CO2 25 01/05/2014 0524   GLUCOSE 113* 01/05/2014 0524   BUN 12 01/05/2014 0524   CREATININE 0.65 01/05/2014 0524   CALCIUM 9.0 01/05/2014 0524   PROT 8.0 01/04/2014 1611   ALBUMIN 4.2 01/04/2014 1611   AST 24 01/04/2014 1611   ALT 16 01/04/2014 1611   ALKPHOS 72 01/04/2014 1611   BILITOT 0.2* 01/04/2014 1611   GFRNONAA >90 01/05/2014 0524   GFRAA >90 01/05/2014 0524   Lipase     Component Value Date/Time   LIPASE 62* 01/04/2014 1611       Studies/Results: No results found.  Anti-infectives: Anti-infectives   Start     Dose/Rate Route Frequency Ordered Stop   01/05/14  0715  cefTRIAXone (ROCEPHIN) 2 g in dextrose 5 % 50 mL IVPB    Comments:  Pharmacy may adjust dosing strength / duration / interval for maximal efficacy   2 g 100 mL/hr over 30 Minutes Intravenous Daily with breakfast 01/05/14 0702     01/05/14 0715  metroNIDAZOLE (FLAGYL) IVPB 500 mg     500 mg 100 mL/hr over 60 Minutes Intravenous 4 times per day 01/05/14 0702     01/05/14 0600  piperacillin-tazobactam (ZOSYN) IVPB 3.375 g  Status:  Discontinued     3.375 g 12.5 mL/hr over 240 Minutes Intravenous 3 times per day 01/04/14 2051 01/05/14 0702   01/04/14 2200  terbinafine (LAMISIL) tablet 250 mg     250 mg Oral Daily 01/04/14 2051     01/04/14 2015  piperacillin-tazobactam (ZOSYN) IVPB 3.375 g     3.375 g 100 mL/hr over 30 Minutes Intravenous  Once 01/04/14 2000 01/04/14 2056   01/04/14 2000  piperacillin-tazobactam (ZOSYN) IVPB 3.375 g  Status:  Discontinued     3.375 g 12.5 mL/hr over 240 Minutes Intravenous  Once 01/04/14 1956 01/04/14 2000       Assessment/Plan 1. 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 Maisie Fus on 01/23/14  -Will likely need antibiotics until her appt  2. DVT prophylaxis - Lovenox  3. HTN  4. ADD  5. History of pyelonephritis  6. Smokes - on nicoderm - She says that she has quit with this illness.  7. Chronic back pain - Sees Dr. Dorna Bloom  8. On chronic pain meds - she takes 10 mg Norco TID (has done this for 4 years) - Sees Dr. Eather Colas     LOS: 5 days    DORT, [facility] 01/09/2014, 10:25 AM Pager: [phone]

## 2014-01-10 ENCOUNTER — Inpatient Hospital Stay (HOSPITAL_COMMUNITY): Payer: Medicaid Other

## 2014-01-10 ENCOUNTER — Encounter (HOSPITAL_COMMUNITY): Payer: Self-pay | Admitting: Radiology

## 2014-01-10 DIAGNOSIS — R1032 Left lower quadrant pain: Secondary | ICD-10-CM | POA: Diagnosis present

## 2014-01-10 DIAGNOSIS — N824 Other female intestinal-genital tract fistulae: Secondary | ICD-10-CM | POA: Diagnosis present

## 2014-01-10 DIAGNOSIS — Z8742 Personal history of other diseases of the female genital tract: Secondary | ICD-10-CM

## 2014-01-10 DIAGNOSIS — D72829 Elevated white blood cell count, unspecified: Secondary | ICD-10-CM

## 2014-01-10 DIAGNOSIS — Z975 Presence of (intrauterine) contraceptive device: Secondary | ICD-10-CM

## 2014-01-10 DIAGNOSIS — R590 Localized enlarged lymph nodes: Secondary | ICD-10-CM | POA: Diagnosis present

## 2014-01-10 LAB — CBC
HEMATOCRIT: 38.6 % (ref 36.0–46.0)
Hemoglobin: 12.6 g/dL (ref 12.0–15.0)
MCH: 31 pg (ref 26.0–34.0)
MCHC: 32.6 g/dL (ref 30.0–36.0)
MCV: 94.8 fL (ref 78.0–100.0)
Platelets: 377 10*3/uL (ref 150–400)
RBC: 4.07 MIL/uL (ref 3.87–5.11)
RDW: 12.2 % (ref 11.5–15.5)
WBC: 6.5 10*3/uL (ref 4.0–10.5)

## 2014-01-10 MED ORDER — CIPROFLOXACIN HCL 500 MG PO TABS: 500.0000 mg | ORAL_TABLET | Freq: Two times a day (BID) | ORAL | Status: DC

## 2014-01-10 MED ORDER — METRONIDAZOLE 500 MG PO TABS: 500.0000 mg | ORAL_TABLET | Freq: Three times a day (TID) | ORAL | Status: DC

## 2014-01-10 MED ORDER — OXYCODONE HCL 5 MG PO TABS: 5.0000 mg | ORAL_TABLET | Freq: Four times a day (QID) | ORAL | Status: DC | PRN

## 2014-01-10 MED ORDER — IOHEXOL 300 MG/ML  SOLN
50.0000 mL | Freq: Once | INTRAMUSCULAR | Status: AC | PRN
  Administered 2014-01-10: 50 mL via ORAL

## 2014-01-10 MED ORDER — ZOLPIDEM TARTRATE 5 MG PO TABS
5.0000 mg | ORAL_TABLET | Freq: Every day | ORAL | Status: DC
  Administered 2014-01-10: 5 mg via ORAL
  Filled 2014-01-10: qty 1

## 2014-01-10 MED ORDER — DIPHENHYDRAMINE HCL 50 MG/ML IJ SOLN: 12.5000 mg | Freq: Four times a day (QID) | INTRAMUSCULAR | Status: DC | PRN

## 2014-01-10 MED ORDER — IOHEXOL 300 MG/ML  SOLN
100.0000 mL | Freq: Once | INTRAMUSCULAR | Status: AC | PRN
  Administered 2014-01-10: 100 mL via INTRAVENOUS

## 2014-01-10 NOTE — Progress Notes (Signed)
Nurse reviewed discharge instructions with pt.  Pt verbalized understanding of discharge instructions, new medications and follow up appointments. No concerns at time of discharge. 

## 2014-01-10 NOTE — Progress Notes (Signed)
[facility] Progress Note     Subjective: Pt's pain is improved but still present.  She has bruises on b/l lower abdomen from lovenox shots.  Pain predominantly in LLQ.  No Fever/chills.  No significant gas or feculent vaginal drainage since yesterday.  Having BM's and urinating well.  Wants to go home today.  Labs normal yesterday.  Objective: Vital signs in last 24 hours: Temp:  [98.2 F (36.8 C)-98.6 F (37 C)] 98.2 F (36.8 C) (07/15 0623) Pulse Rate:  [65-83] 65 (07/15 0623) Resp:  [18] 18 (07/15 0623) BP: (103-124)/(68-81) 103/72 mmHg (07/15 0623) SpO2:  [100 %] 100 % (07/15 0623) Last BM Date: 01/08/14  Intake/Output from previous day: 07/14 0701 - 07/15 0700 In: 3500 [P.O.:1400; I.V.:1650; IV Piggyback:450] Out: 3400 [Urine:3400] Intake/Output this shift:    PE: Gen:  Alert, NAD, pleasant Abd: Soft, tender in LLQ, +BS, no HSM, no peritoneal signs   Lab Results:   Recent Labs  01/08/14 0519 01/09/14 1108  WBC 8.6 6.3  HGB 11.7* 12.6  HCT 35.8* 38.1  PLT 371 402*   BMET  Recent Labs  01/09/14 1108  NA 140  K 4.4  CL 101  CO2 28  GLUCOSE 109*  BUN 7  CREATININE 0.68  CALCIUM 9.5   PT/INR No results found for this basename: LABPROT, INR,  in the last 72 hours CMP     Component Value Date/Time   NA 140 01/09/2014 1108   K 4.4 01/09/2014 1108   CL 101 01/09/2014 1108   CO2 28 01/09/2014 1108   GLUCOSE 109* 01/09/2014 1108   BUN 7 01/09/2014 1108   CREATININE 0.68 01/09/2014 1108   CALCIUM 9.5 01/09/2014 1108   PROT 8.0 01/04/2014 1611   ALBUMIN 4.2 01/04/2014 1611   AST 24 01/04/2014 1611   ALT 16 01/04/2014 1611   ALKPHOS 72 01/04/2014 1611   BILITOT 0.2* 01/04/2014 1611   GFRNONAA >90 01/09/2014 1108   GFRAA >90 01/09/2014 1108   Lipase     Component Value Date/Time   LIPASE 62* 01/04/2014 1611       Studies/Results: No results found.  Anti-infectives: Anti-infectives   Start     Dose/Rate Route Frequency Ordered Stop   01/05/14  0715  cefTRIAXone (ROCEPHIN) 2 g in dextrose 5 % 50 mL IVPB    Comments:  Pharmacy may adjust dosing strength / duration / interval for maximal efficacy   2 g 100 mL/hr over 30 Minutes Intravenous Daily with breakfast 01/05/14 0702     01/05/14 0715  metroNIDAZOLE (FLAGYL) IVPB 500 mg     500 mg 100 mL/hr over 60 Minutes Intravenous 4 times per day 01/05/14 0702     01/05/14 0600  piperacillin-tazobactam (ZOSYN) IVPB 3.375 g  Status:  Discontinued     3.375 g 12.5 mL/hr over 240 Minutes Intravenous 3 times per day 01/04/14 2051 01/05/14 0702   01/04/14 2200  terbinafine (LAMISIL) tablet 250 mg     250 mg Oral Daily 01/04/14 2051     01/04/14 2015  piperacillin-tazobactam (ZOSYN) IVPB 3.375 g     3.375 g 100 mL/hr over 30 Minutes Intravenous  Once 01/04/14 2000 01/04/14 2056   01/04/14 2000  piperacillin-tazobactam (ZOSYN) IVPB 3.375 g  Status:  Discontinued     3.375 g 12.5 mL/hr over 240 Minutes Intravenous  Once 01/04/14 1956 01/04/14 2000       Assessment/Plan 1. 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 Maisie Fushomas on 01/23/14  -Will likely need antibiotics until her appt (cipro/flagyl) 2. DVT prophylaxis - Lovenox  3. HTN  4. ADD  5. History of pyelonephritis  6. Smokes - on nicoderm - She says that she has quit with this illness.  7. Chronic back pain - Sees Dr. Dorna BloomWu  8. On chronic pain meds - she takes 10 mg Norco TID (has done this for 4 years) - Sees Dr. Eather ColasHeag     LOS: 6 days    [place], [facility] 01/10/2014, 7:40 AM Pager: [phone]

## 2014-01-10 NOTE — Discharge Instructions (Signed)
Diverticulitis Diverticulitis is inflammation or infection of small pouches in your colon that form when you have a condition called diverticulosis. The pouches in your colon are called diverticula. Your colon, or large intestine, is where water is absorbed and stool is formed. Complications of diverticulitis can include:  Bleeding.  Severe infection.  Severe pain.  Perforation of your colon.  Obstruction of your colon. CAUSES  Diverticulitis is caused by bacteria. Diverticulitis happens when stool becomes trapped in diverticula. This allows bacteria to grow in the diverticula, which can lead to inflammation and infection. RISK FACTORS People with diverticulosis are at risk for diverticulitis. Eating a diet that does not include enough fiber from fruits and vegetables may make diverticulitis more likely to develop. SYMPTOMS  Symptoms of diverticulitis may include:  Abdominal pain and tenderness. The pain is normally located on the left side of the abdomen, but may occur in other areas.  Fever and chills.  Bloating.  Cramping.  Nausea.  Vomiting.  Constipation.  Diarrhea.  Blood in your stool. DIAGNOSIS  Your health care provider will ask you about your medical history and do a physical exam. You may need to have tests done because many medical conditions can cause the same symptoms as diverticulitis. Tests may include:  Blood tests.  Urine tests.  Imaging tests of the abdomen, including X-rays and CT scans. When your condition is under control, your health care provider may recommend that you have a colonoscopy. A colonoscopy can show how severe your diverticula are and whether something else is causing your symptoms. TREATMENT  Most cases of diverticulitis are mild and can be treated at home. Treatment may include:  Taking over-the-counter pain medicines.  Following a clear liquid diet.  Taking antibiotic medicines by mouth for 7-10 days. More severe cases may  be treated at a hospital. Treatment may include:  Not eating or drinking.  Taking prescription pain medicine.  Receiving antibiotic medicines through an IV tube.  Receiving fluids and nutrition through an IV tube.  Surgery. HOME CARE INSTRUCTIONS   Follow your health care provider's instructions carefully.  Follow a full liquid diet or other diet as directed by your health care provider. After your symptoms improve, your health care provider may tell you to change your diet. He or she may recommend you eat a high-fiber diet. Fruits and vegetables are good sources of fiber. Fiber makes it easier to pass stool.  Take fiber supplements or probiotics as directed by your health care provider.  Only take medicines as directed by your health care provider.  Keep all your follow-up appointments. SEEK MEDICAL CARE IF:   Your pain does not improve.  You have a hard time eating food.  Your bowel movements do not return to normal. SEEK IMMEDIATE MEDICAL CARE IF:   Your pain becomes worse.  Your symptoms do not get better.  Your symptoms suddenly get worse.  You have a fever.  You have repeated vomiting.  You have bloody or black, tarry stools. MAKE SURE YOU:   Understand these instructions.  Will watch your condition.  Will get help right away if you are not doing well or get worse. Document Released: 03/25/2005 Document Revised: 06/20/2013 Document Reviewed: 05/10/2013 [facility] Patient Information 2015 [place], [place]. This information is not intended to replace advice given to you by your health care provider. Make sure you discuss any questions you have with your health care provider.  Diverticulitis Diverticulitis is when small pockets that have formed in your colon (large  intestine) become infected or swollen. HOME CARE  Follow your doctor's instructions.  Follow a special diet if told by your doctor.  When you feel better, your doctor may tell you to change your  diet. You may be told to eat a lot of fiber. Fruits and vegetables are good sources of fiber. Fiber makes it easier to poop (have bowel movements).  Take supplements or probiotics as told by your doctor.  Only take medicines as told by your doctor.  Keep all follow-up visits with your doctor. GET HELP IF:  Your pain does not get better.  You have a hard time eating food.  You are not pooping like normal. GET HELP RIGHT AWAY IF:  Your pain gets worse.  Your problems do not get better.  Your problems suddenly get worse.  You have a fever.  You keep throwing up (vomiting).  You have bloody or black, tarry poop (stool). MAKE SURE YOU:   Understand these instructions.  Will watch your condition.  Will get help right away if you are not doing well or get worse. Document Released: 12/02/2007 Document Revised: 06/20/2013 Document Reviewed: 05/10/2013 [facility] Patient Information 2015 [place]. This information is not intended to replace advice given to you by your health care provider. Make sure you discuss any questions you have with your health care provider. Low-Fiber Diet Fiber is found in fruits, vegetables, and whole grains. A low-fiber diet restricts fibrous foods that are not digested in the small intestine. A diet containing about 10-15 grams of fiber per day is considered low fiber. Low-fiber diets may be used to: Promote healing and rest the bowel during intestinal flare-ups. Prevent blockage of a partially obstructed or narrowed gastrointestinal tract. Reduce fecal weight and volume. Slow the movement of feces. You may be on a low-fiber diet as a transitional diet following surgery, after an injury (trauma), or because of a short (acute) or lifelong (chronic) illness. Your health care provider will determine the length of time you need to stay on this diet.  WHAT DO I NEED TO KNOW ABOUT A LOW-FIBER DIET? Always check the fiber content on the packaging's  Nutrition Facts label, especially on foods from the grains list. Ask your dietitian if you have questions about specific foods that are related to your condition, especially if the food is not listed below. In general, a low-fiber food will have less than 2 g of fiber. WHAT FOODS CAN I EAT? Grains All breads and crackers made with white flour. Sweet rolls, doughnuts, waffles, pancakes, [place] toast, bagels. Pretzels, Melba toast, zwieback. Well-cooked cereals, such as cornmeal, farina, or cream cereals. Dry cereals that do not contain whole grains, fruit, or nuts, such as refined corn, wheat, rice, and oat cereals. Potatoes prepared any way without skins, plain pastas and noodles, refined white rice. Use white flour for baking and making sauces. Use allowed list of grains for casseroles, dumplings, and puddings.  Vegetables Strained tomato and vegetable juices. Fresh lettuce, cucumber, spinach. Well-cooked (no skin or pulp) or canned vegetables, such as asparagus, bean sprouts, beets, carrots, green beans, mushrooms, potatoes, pumpkin, spinach, yellow squash, tomato sauce/puree, turnips, yams, and zucchini. Keep servings limited to  cup.  Fruits All fruit juices except prune juice. Cooked or canned fruits without skin and seeds, such as applesauce, apricots, cherries, fruit cocktail, grapefruit, grapes, mandarin oranges, melons, peaches, pears, pineapple, and plums. Fresh fruits without skin, such as apricots, avocados, bananas, melons, pineapple, nectarines, and peaches. Keep servings limited to  cup or 1 piece.  Meat and Other Protein Sources Ground or well-cooked tender beef, ham, veal, lamb, pork, or poultry. Eggs, plain cheese. Fish, oysters, shrimp, lobster, and other seafood. Liver, organ meats. Smooth nut butters. Dairy All milk products and alternative dairy substitutes, such as soy, rice, almond, and coconut, not containing added whole nuts, seeds, or added fruit. Beverages Decaf coffee,  fruit, and vegetable juices or smoothies (small amounts, with no pulp or skins, and with fruits from allowed list), sports drinks, herbal tea. Condiments Ketchup, mustard, vinegar, cream sauce, cheese sauce, cocoa powder. Spices in moderation, such as allspice, basil, bay leaves, celery powder or leaves, cinnamon, cumin powder, curry powder, ginger, mace, marjoram, onion or garlic powder, oregano, paprika, parsley flakes, ground pepper, rosemary, sage, savory, tarragon, thyme, and turmeric. Sweets and Desserts Plain cakes and cookies, pie made with allowed fruit, pudding, custard, cream pie. Gelatin, fruit, ice, sherbet, frozen ice pops. Ice cream, ice milk without nuts. Plain hard candy, honey, jelly, molasses, syrup, sugar, chocolate syrup, gumdrops, marshmallows. Limit overall sugar intake.  Fats and Oil Margarine, butter, cream, mayonnaise, salad oils, plain salad dressings made from allowed foods. Choose healthy fats such as olive oil, canola oil, and omega-3 fatty acids (such as found in salmon or tuna) when possible.  Other Bouillon, broth, or cream soups made from allowed foods. Any strained soup. Casseroles or mixed dishes made with allowed foods. The items listed above may not be a complete list of recommended foods or beverages. Contact your dietitian for more options.  WHAT FOODS ARE NOT RECOMMENDED? Grains All whole wheat and whole grain breads and crackers. Multigrains, rye, bran seeds, nuts, or coconut. Cereals containing whole grains, multigrains, bran, coconut, nuts, raisins. Cooked or dry oatmeal, steel-cut oats. Coarse wheat cereals, granola. Cereals advertised as high fiber. Potato skins. Whole grain pasta, wild or brown rice. Popcorn. Coconut flour. Bran, buckwheat, corn bread, multigrains, rye, wheat germ.  Vegetables Fresh, cooked or canned vegetables, such as artichokes, asparagus, beet greens, broccoli, Brussels sprouts, cabbage, celery, cauliflower, corn, eggplant, kale,  legumes or beans, okra, peas, and tomatoes. Avoid large servings of any vegetables, especially raw vegetables.  Fruits Fresh fruits, such as apples with or without skin, berries, cherries, figs, grapes, grapefruit, guavas, kiwis, mangoes, oranges, papayas, pears, persimmons, pineapple, and pomegranate. Prune juice and juices with pulp, stewed or dried prunes. Dried fruits, dates, raisins. Fruit seeds or skins. Avoid large servings of all fresh fruits. Meats and Other Protein Sources Tough, fibrous meats with gristle. Chunky nut butter. Cheese made with seeds, nuts, or other foods not recommended. Nuts, seeds, legumes (beans, including baked beans), dried peas, beans, lentils.  Dairy Yogurt or cheese that contains nuts, seeds, or added fruit.  Beverages Fruit juices with high pulp, prune juice. Caffeinated coffee and teas.  Condiments Coconut, maple syrup, pickles, olives. Sweets and Desserts Desserts, cookies, or candies that contain nuts or coconut, chunky peanut butter, dried fruits. Jams, preserves with seeds, marmalade. Large amounts of sugar and sweets. Any other dessert made with fruits from the not recommended list.  Other Soups made from vegetables that are not recommended or that contain other foods not recommended.  The items listed above may not be a complete list of foods and beverages to avoid. Contact your dietitian for more information. Document Released: 12/05/2001 Document Revised: 06/20/2013 Document Reviewed: 05/08/2013 [facility] Patient Information 2015 [place], [place]. This information is not intended to replace advice given to you by your health care provider. Make sure you discuss  any questions you have with your health care provider. ° °

## 2014-01-10 NOTE — Discharge Summary (Signed)
[facility] Discharge Summary   Patient ID: Brandy Vaughn MRN: [id number] DOB/AGE: 40/02/1976 40 y.o.  Admit date: 01/04/2014 Discharge date: 01/10/2014  Admitting Diagnosis: Acute diverticulitis Colovaginal fistula IUD in place Leukocytosis Mesenteric and perirectal lymphadenopathy  Discharge Diagnosis Patient Active Problem List   Diagnosis Date Noted  . History of recurrent vaginal discharge 01/10/2014  . Abdominal pain, left lower quadrant 01/10/2014  . Vaginal tear resulting from childbirth 01/10/2014  . 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 IUD (intrauterine device) in place 01/10/2014  . Lymphadenopathy, mesenteric 01/10/2014  . Acute diverticulitis 01/04/2014    Consultants None  Imaging: Ct Abdomen Pelvis W Contrast  01/10/2014   ADDENDUM REPORT: 01/10/2014 12:29  ADDENDUM: These results were called by telephone at the time of interpretation on 01/10/2014 at 12:29 pm to [facility] DORT, PA , who verbally acknowledged these results.   Electronically Signed   By: Malachy MoanHeath  McCullough M.D.   On: 01/10/2014 12:29   01/10/2014   CLINICAL DATA:  History of diverticulitis with persistent abdominal pain and possible colovaginal fistula  EXAM: CT ABDOMEN AND PELVIS WITH CONTRAST  TECHNIQUE: Multidetector CT imaging of the abdomen and pelvis was performed using the standard protocol following bolus administration of intravenous contrast.  CONTRAST:  100mL OMNIPAQUE IOHEXOL 300 MG/ML  SOLN  COMPARISON:  Prior CT abdomen/ pelvis 01/04/2014 and 12/24/2013  FINDINGS: Lower Chest: The lung bases are clear. Visualized cardiac structures are within normal limits for size. No pericardial effusion. Unremarkable visualized distal thoracic esophagus.  Abdomen: Unremarkable CT appearance of the stomach, duodenum, spleen, adrenal glands and pancreas. Normal hepatic contours and morphology. No discrete hepatic lesion. Gallbladder is unremarkable. No intra or extrahepatic biliary ductal dilatation.   Unremarkable appearance of the bilateral kidneys. No focal solid lesion, hydronephrosis or nephrolithiasis. Complete resolution of the prior findings of right-sided pyelonephritis compared to the more recent prior CT scans.  Continued improvement in inflammatory change at the site of prior active diverticulitis in the heavily redundant sigmoid colon. However, there is a persistent tract of inflammatory stranding and fluid containing tiny locules of extra luminal gas extending from the sigmoid colon toward the left posterolateral aspect of the vaginal fornix at the 5 o'clock position. No definite communication can be identified by CT. Stable sigmoid and perirectal mesenteric lymph nodes. An index sigmoid mesocolon node measures 6 mm in short axis on image 67 of series 2. This is unchanged compared to prior. A perirectal lymph node measures 6 mm in short axis as well which may be incrementally increased compared to 5 mm previously. Subtle differences favored to be related to slice selection.  Pelvis: IUD well position within the endometrial canal. The uterus itself is unremarkable. Unremarkable at adnexa for reproductive age female. Normal appearance of the bladder. Stranding and thickening about the left posterolateral aspect of the vaginal fornix. Gas is present within the vagina, however this is nonspecific.  Bones/Soft Tissues: No acute fracture or aggressive appearing lytic or blastic osseous lesion.  Vascular: No significant atherosclerotic vascular disease, aneurysmal dilatation or acute abnormality.  IMPRESSION: 1. Linear tract of extraluminal stranding, fluid and tiny locular of gas extending from the sigmoid colon to the left posterolateral aspect of vaginal fornix highly concerning for colovaginal fistula consistent with the patient's clinical symptoms. 2. An IUD is present. Currently, there is no inflammatory change surrounding the uterus itself. However, consideration should be given to removal of the IUD  as the presence of this foreign body in the setting of a colovaginal fistula  may increase the risk of developing endometritis. 3. 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 Overall, the degree of inflammation involving the sigmoid colon appears slightly improved. There is no drainable fluid collection. 4. Stable sigmoid mesocolon and perirectal lymph nodes. Although these are likely reactive and related to the chronic inflammation of the colovaginal fistula, the possibility of a underlying colonic neoplasm is difficult to exclude radiographically. 5. Interval resolution of right pyelonephritis compared to prior imaging.  Electronically Signed: By: Malachy Moan M.D. On: 01/10/2014 12:02    Procedures None  Hospital Course:  38 y/o white female with PMH chronic back pain on narcotics who presented to [facility] with lower abdominal pain, fever, chills, nausea.  She had been seen on June 28 and diagnosed with pyelonephritis and treated with Cipro. Her last dose of Cipro was 3 days ago. While in the emergency department she was noted to have a leukocytosis similar to what she had on June 28. CT (01/04/14) scan demonstrated findings consistent with acute sigmoid diverticulitis with small amount of extraluminal air. The changes of right pyelonephritis were improved. She also states that she has been referred to Dr. Maisie Fus of our practice because of a suspicion of a rectovaginal fistula. She states she intermittently has been passing feculent fluid from her vagina and also passing some air from her vagina. This occurs occasionally during a bowel movement. She has a history of a third degree vaginal tear during the delivery of her first child.  She has a history of previous diverticulitis. Her first episode was in September of 2014 which responded to oral antibiotics. She could not afford a colonoscopy  which was recommended by her gastroenterologist, Dr. Danise Edge.  Patient was admitted on 01/04/14 and was transferred to the floor for conservative management.  Her pain and vaginal drainage started to improve with the IV antibiotics.  Her leukocytosis also improved.  Diet was advanced as tolerated.  Upon advancing her diet to soft food her symptoms returned including increased abdominal pain, and vaginal discharge/gas.  We believe that her chronic pain may be playing a role in her current abdominal pain as well which could be contributing to her symptoms.  On HD #6 we repeated a CT scan which confirmed a colovaginal fistula between the sigmoid colon and the left posterolateral aspect of vaginal fornix.  She still has an IUD in place and we have instructed her to contact Dr. Senaida Ores her GYN to have it removed ASAP due to risk of endometritis.  I have sent Dr. Senaida Ores a message in epic.  She has no new drainable fluid collections/abscesses.  She has stable sigmoid mesocolon and perirectal lymph nodes which are prominent and likely reactive, but the radiologist recommended colonoscopy to rule out malignancy.  Her leukocytosis has resolved and is now 6.5.  On HD #6, the patient was voiding well, tolerating diet, ambulating well, pain well controlled, vital signs stable, and felt stable for discharge home.  Patient will follow up in our office on 01/23/14 at 10:35am with Dr. Maisie Fus for a new patient evaluation and knows to call with questions or concerns.  She should monitor her pain and drainage levels and stay on a full liquid diet for now.  She is encouraged to drink protein supplements like ensure/boost.  She will continue her antibiotics (cipro/flagyl) until told to stop by Dr. Maisie Fus.  Medication List         acetaminophen 325 MG tablet  Commonly known as:  TYLENOL  Take 650 mg by mouth every 6 (six) hours as needed (pain).     ciprofloxacin 500 MG tablet  Commonly known as:  CIPRO   Take 1 tablet (500 mg total) by mouth 2 (two) times daily.     HYDROcodone-acetaminophen 10-325 MG per tablet  Commonly known as:  NORCO  Take 1 tablet by mouth 3 (three) times daily.     lisdexamfetamine 40 MG capsule  Commonly known as:  VYVANSE  Take 1 capsule (40 mg total) by mouth daily as needed (for focus).     methocarbamol 750 MG tablet  Commonly known as:  ROBAXIN  Take 750 mg by mouth 3 (three) times daily as needed for muscle spasms. For back pain     metroNIDAZOLE 500 MG tablet  Commonly known as:  FLAGYL  Take 1 tablet (500 mg total) by mouth 3 (three) times daily.     multivitamin with minerals Tabs tablet  Take 1 tablet by mouth daily.     ondansetron 4 MG tablet  Commonly known as:  ZOFRAN  Take 1 tablet (4 mg total) by mouth every 6 (six) hours.     oxyCODONE 5 MG immediate release tablet  Commonly known as:  Oxy IR/ROXICODONE  Take 1-2 tablets (5-10 mg total) by mouth every 6 (six) hours as needed for moderate pain, severe pain or breakthrough pain.     terbinafine 250 MG tablet  Commonly known as:  LAMISIL  Take 1 tablet (250 mg total) by mouth daily.     valsartan-hydrochlorothiazide 160-25 MG per tablet  Commonly known as:  DIOVAN HCT  Take 1 tablet by mouth daily.         Follow-up Information   Follow up with Vanita Panda., MD On 01/23/2014. (For new patient evaluation at 10:35am, please arrive by 10:05 for check in and to fill out paperwork.  )    Specialty:  General Surgery   Contact information:   [address], [address] [place] [address] [phone]        Signed: Aris Georgia, [facility] [phone]  01/10/2014, 1:26 PM

## 2014-01-10 NOTE — Progress Notes (Signed)
md gross called and stated to give pt benadryl and/or ambien. Please refer to orders.

## 2014-01-23 ENCOUNTER — Ambulatory Visit (INDEPENDENT_AMBULATORY_CARE_PROVIDER_SITE_OTHER): Payer: Medicaid Other | Admitting: General Surgery

## 2014-01-23 ENCOUNTER — Encounter (INDEPENDENT_AMBULATORY_CARE_PROVIDER_SITE_OTHER): Payer: Self-pay | Admitting: General Surgery

## 2014-01-23 VITALS — BP 128/86 | HR 88 | Temp 97.9°F | Resp 18 | Ht 67.5 in | Wt 146.6 lb

## 2014-01-23 DIAGNOSIS — K5720 Diverticulitis of large intestine with perforation and abscess without bleeding: Secondary | ICD-10-CM

## 2014-01-23 DIAGNOSIS — K5732 Diverticulitis of large intestine without perforation or abscess without bleeding: Secondary | ICD-10-CM

## 2014-01-23 DIAGNOSIS — K630 Abscess of intestine: Secondary | ICD-10-CM

## 2014-01-23 NOTE — Progress Notes (Signed)
Chief Complaint  Patient presents with  . Other    new pt- eval fistula    HISTORY:  Brandy Vaughn is a 38 y.o. female who presents to the office with diverticulitis.  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  She saw Dr Laural Benes but did not get a colonoscopy due to finances.  She is still having abdominal pain.  No blood in stool.  Her Bm's are regular and loose.  She denies a h/o constipation and does take chronic narcotics for back pain.  She is currently taking cipro and flagyl.  She is to get her IUD removed on Aug 6.  She does have a FH of colon cancer in her mother at around 29 yo.    Past Medical History  Diagnosis Date  . Hypertension   . ADD (attention deficit disorder)   . Rosacea   . Vaginal tear resulting from childbirth     3rd degree  . Chronic back pain     on chronic narcotics       Past Surgical History  Procedure Laterality Date  . Wisdom tooth extraction        Current Outpatient Prescriptions  Medication Sig Dispense Refill  . acetaminophen (TYLENOL) 325 MG tablet Take 650 mg by mouth every 6 (six) hours as needed (pain).      . ciprofloxacin (CIPRO) 500 MG tablet Take 1 tablet (500 mg total) by mouth 2 (two) times daily.  42 tablet  0  . HYDROcodone-acetaminophen (NORCO) 10-325 MG per tablet Take 1 tablet by mouth 3 (three) times daily.  15 tablet  0  . lisdexamfetamine (VYVANSE) 40 MG capsule Take 1 capsule (40 mg total) by mouth daily as needed (for focus).  30 capsule  0  . methocarbamol (ROBAXIN) 750 MG tablet Take 750 mg by mouth 3 (three) times daily as needed for muscle spasms. For back pain      . metroNIDAZOLE (FLAGYL) 500 MG tablet Take 1 tablet (500 mg total) by mouth 3 (three) times daily.  63 tablet  0  . Multiple Vitamin (MULTIVITAMIN WITH MINERALS) TABS tablet Take 1 tablet by mouth daily.      . nicotine (NICODERM CQ - DOSED IN MG/24 HOURS) 14 mg/24hr patch Place 14 mg onto the skin daily.      .  ondansetron (ZOFRAN) 4 MG tablet Take 1 tablet (4 mg total) by mouth every 6 (six) hours.  12 tablet  0  . oxyCODONE (OXY IR/ROXICODONE) 5 MG immediate release tablet Take 1-2 tablets (5-10 mg total) by mouth every 6 (six) hours as needed for moderate pain, severe pain or breakthrough pain.  40 tablet  0  . terbinafine (LAMISIL) 250 MG tablet Take 1 tablet (250 mg total) by mouth daily.  90 tablet  0  . valsartan-hydrochlorothiazide (DIOVAN HCT) 160-25 MG per tablet Take 1 tablet by mouth daily.  30 tablet  11   No current facility-administered medications for this visit.     No Known Allergies    Family History  Problem Relation Age of Onset  . Cancer Mother     Colon, Cervical   . Hypertension Father   . Osteoporosis Paternal Grandmother   . Hypertension Paternal Grandmother   . Cancer Paternal Grandfather     Bladder, Throat       History   Social History  . Marital Status: Divorced    Spouse Name: N/A    Number of Children:  N/A  . Years of Education: N/A   Social History Main Topics  . Smoking status: Former Smoker -- 0.50 packs/day    Types: Cigarettes  . Smokeless tobacco: Current User     Comment: She is trying E-Cigarrettes to quit smoking   . Alcohol Use: No  . Drug Use: No  . Sexual Activity: Yes    Partners: Male   Other Topics Concern  . None   Social History Narrative   Marital Status:  Separated Chief Technology Officer)    Children:  Daughters (2)    Pets: Dog (Zoey)    Living Situation: Lives with children.    Occupation: Unemployed    EducationEngineer, petroleum    Tobacco Use/Exposure:  She smokes 1/2 ppd.    Alcohol Use:  Occasional   Drug Use:  None   Diet:  Regular   Exercise:  Limited    Hobbies:  Reading        REVIEW OF SYSTEMS - PERTINENT POSITIVES ONLY: Review of Systems - General ROS: negative for - chills or fever, positive for fatigue Respiratory ROS: no cough, shortness of breath, or wheezing Cardiovascular ROS: no chest pain or dyspnea  on exertion Gastrointestinal ROS: positive for - abdominal pain negative for - blood in stools, change in bowel habits or constipation Genito-Urinary ROS: no dysuria, trouble voiding, or hematuria  EXAM: Filed Vitals:   01/23/14 1039  BP: 128/86  Pulse: 88  Temp: 97.9 F (36.6 C)  Resp: 18    General appearance: alert and cooperative Resp: clear to auscultation bilaterally Cardio: regular rate and rhythm GI: soft, non-tender; bowel sounds normal; no masses,  no organomegaly   RADIOLOGY RESULTS:   Images and reports are reviewed. IMPRESSION:  1. Linear tract of extraluminal stranding, fluid and tiny locular of  gas extending from the sigmoid colon to the left posterolateral  aspect of vaginal fornix highly concerning for colovaginal fistula  consistent with the patient's clinical symptoms.  2. An IUD is present. Currently, there is no inflammatory change  surrounding the uterus itself. However, consideration should be  given to removal of the IUD as the presence of this foreign body in  the setting of a colovaginal fistula may increase the risk of  developing endometritis.  3. 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 Overall, the degree of inflammation  involving the sigmoid colon appears slightly improved. There is no  drainable fluid collection.  4. Stable sigmoid mesocolon and perirectal lymph nodes. Although  these are likely reactive and related to the chronic inflammation of  the colovaginal fistula, the possibility of a underlying colonic  neoplasm is difficult to exclude radiographically.  5. Interval resolution of right pyelonephritis compared to prior  imaging.   ASSESSMENT AND PLAN: Brandy Vaughn is a 38 year old female who presents to the office after her second bout of diverticulitis. She was recently hospitalized and given IV  antibiotics. A CT scan did show evidence of perforation but no abscess.  I recommended that she continue her three-week course of Cipro and Flagyl. She will call the office if her symptoms worsen after coming off of antibiotics. We will refill these if this occurs. Like her to see Dr. Laural Benes to get a colonoscopy given her family history of colon cancer and CT findings. I will see her back in the office in 6-8 weeks to discuss surgical resection  once the inflammation has reduced.   Vanita PandaAlicia C Charmin Aguiniga, MD Colon and Rectal Surgery / General Surgery [facility]      Visit Diagnoses: 1. Diverticulitis of large intestine with abscess without bleeding     Primary Care Physician: [name], ROBYN, MD

## 2014-01-23 NOTE — Patient Instructions (Signed)
Try to wean your narcotics.  Avoid uncooked fruits and vegetables.  Call the office if your pain persists or gets worse off antibiotics.

## 2014-01-25 ENCOUNTER — Encounter (HOSPITAL_COMMUNITY): Payer: Self-pay | Admitting: Emergency Medicine

## 2014-01-25 ENCOUNTER — Emergency Department (HOSPITAL_COMMUNITY)
Admission: EM | Admit: 2014-01-25 | Discharge: 2014-01-25 | Disposition: A | Discharge status: Home / Self Care | Payer: Medicaid Other | Attending: Emergency Medicine | Admitting: Emergency Medicine

## 2014-01-25 ENCOUNTER — Emergency Department (HOSPITAL_COMMUNITY): Payer: Medicaid Other

## 2014-01-25 DIAGNOSIS — Z87891 Personal history of nicotine dependence: Secondary | ICD-10-CM | POA: Diagnosis not present

## 2014-01-25 DIAGNOSIS — K5732 Diverticulitis of large intestine without perforation or abscess without bleeding: Secondary | ICD-10-CM | POA: Insufficient documentation

## 2014-01-25 DIAGNOSIS — I10 Essential (primary) hypertension: Secondary | ICD-10-CM | POA: Insufficient documentation

## 2014-01-25 DIAGNOSIS — Z3202 Encounter for pregnancy test, result negative: Secondary | ICD-10-CM | POA: Insufficient documentation

## 2014-01-25 DIAGNOSIS — R509 Fever, unspecified: Secondary | ICD-10-CM | POA: Insufficient documentation

## 2014-01-25 DIAGNOSIS — Z872 Personal history of diseases of the skin and subcutaneous tissue: Secondary | ICD-10-CM | POA: Diagnosis not present

## 2014-01-25 DIAGNOSIS — Z792 Long term (current) use of antibiotics: Secondary | ICD-10-CM | POA: Insufficient documentation

## 2014-01-25 DIAGNOSIS — Z79899 Other long term (current) drug therapy: Secondary | ICD-10-CM | POA: Diagnosis not present

## 2014-01-25 DIAGNOSIS — R109 Unspecified abdominal pain: Secondary | ICD-10-CM | POA: Diagnosis present

## 2014-01-25 DIAGNOSIS — G8929 Other chronic pain: Secondary | ICD-10-CM | POA: Diagnosis not present

## 2014-01-25 DIAGNOSIS — F909 Attention-deficit hyperactivity disorder, unspecified type: Secondary | ICD-10-CM | POA: Diagnosis not present

## 2014-01-25 DIAGNOSIS — K5780 Diverticulitis of intestine, part unspecified, with perforation and abscess without bleeding: Secondary | ICD-10-CM

## 2014-01-25 HISTORY — DX: Diverticulitis of intestine, part unspecified, without perforation or abscess without bleeding: K57.92

## 2014-01-25 LAB — CBC WITH DIFFERENTIAL/PLATELET
BASOS PCT: 0 % (ref 0–1)
Basophils Absolute: 0 10*3/uL (ref 0.0–0.1)
EOS ABS: 0.3 10*3/uL (ref 0.0–0.7)
EOS PCT: 4 % (ref 0–5)
HCT: 37 % (ref 36.0–46.0)
Hemoglobin: 12.6 g/dL (ref 12.0–15.0)
LYMPHS ABS: 2.6 10*3/uL (ref 0.7–4.0)
Lymphocytes Relative: 30 % (ref 12–46)
MCH: 32 pg (ref 26.0–34.0)
MCHC: 34.1 g/dL (ref 30.0–36.0)
MCV: 93.9 fL (ref 78.0–100.0)
Monocytes Absolute: 0.5 10*3/uL (ref 0.1–1.0)
Monocytes Relative: 6 % (ref 3–12)
Neutro Abs: 5.2 10*3/uL (ref 1.7–7.7)
Neutrophils Relative %: 60 % (ref 43–77)
Platelets: 249 10*3/uL (ref 150–400)
RBC: 3.94 MIL/uL (ref 3.87–5.11)
RDW: 12.8 % (ref 11.5–15.5)
WBC: 8.6 10*3/uL (ref 4.0–10.5)

## 2014-01-25 LAB — URINE MICROSCOPIC-ADD ON

## 2014-01-25 LAB — POC URINE PREG, ED: PREG TEST UR: NEGATIVE

## 2014-01-25 LAB — URINALYSIS, ROUTINE W REFLEX MICROSCOPIC
Bilirubin Urine: NEGATIVE
Glucose, UA: NEGATIVE mg/dL
HGB URINE DIPSTICK: NEGATIVE
Ketones, ur: NEGATIVE mg/dL
NITRITE: NEGATIVE
Protein, ur: NEGATIVE mg/dL
SPECIFIC GRAVITY, URINE: 1.016 (ref 1.005–1.030)
UROBILINOGEN UA: 0.2 mg/dL (ref 0.0–1.0)
pH: 7.5 (ref 5.0–8.0)

## 2014-01-25 LAB — COMPREHENSIVE METABOLIC PANEL
ALT: 20 U/L (ref 0–35)
AST: 15 U/L (ref 0–37)
Albumin: 3.9 g/dL (ref 3.5–5.2)
Alkaline Phosphatase: 49 U/L (ref 39–117)
Anion gap: 13 (ref 5–15)
BUN: 9 mg/dL (ref 6–23)
CALCIUM: 9.5 mg/dL (ref 8.4–10.5)
CO2: 25 mEq/L (ref 19–32)
Chloride: 99 mEq/L (ref 96–112)
Creatinine, Ser: 0.59 mg/dL (ref 0.50–1.10)
GFR calc Af Amer: >90 mL/min (ref >90)
GFR calc non Af Amer: >90 mL/min (ref >90)
GLUCOSE: 92 mg/dL (ref 70–99)
Potassium: 3.9 mEq/L (ref 3.7–5.3)
SODIUM: 137 meq/L (ref 137–147)
TOTAL PROTEIN: 6.9 g/dL (ref 6.0–8.3)
Total Bilirubin: 0.3 mg/dL (ref 0.3–1.2)

## 2014-01-25 MED ORDER — IOHEXOL 300 MG/ML  SOLN
100.0000 mL | Freq: Once | INTRAMUSCULAR | Status: AC | PRN
  Administered 2014-01-25: 100 mL via INTRAVENOUS

## 2014-01-25 MED ORDER — HYDROMORPHONE HCL PF 1 MG/ML IJ SOLN
0.5000 mg | Freq: Once | INTRAMUSCULAR | Status: AC
  Administered 2014-01-25: 0.5 mg via INTRAVENOUS

## 2014-01-25 MED ORDER — HYDROMORPHONE HCL PF 1 MG/ML IJ SOLN
INTRAMUSCULAR | Status: AC
  Filled 2014-01-25: qty 1

## 2014-01-25 MED ORDER — TRAMADOL HCL 50 MG PO TABS: 50.0000 mg | ORAL_TABLET | Freq: Four times a day (QID) | ORAL | Status: DC | PRN

## 2014-01-25 MED ORDER — ONDANSETRON HCL 4 MG/2ML IJ SOLN
4.0000 mg | Freq: Once | INTRAMUSCULAR | Status: AC
  Administered 2014-01-25: 4 mg via INTRAVENOUS
  Filled 2014-01-25: qty 2

## 2014-01-25 MED ORDER — MORPHINE SULFATE 4 MG/ML IJ SOLN
4.0000 mg | Freq: Once | INTRAMUSCULAR | Status: AC
  Administered 2014-01-25: 4 mg via INTRAVENOUS
  Filled 2014-01-25: qty 1

## 2014-01-25 MED ORDER — HYDROMORPHONE HCL PF 1 MG/ML IJ SOLN
0.5000 mg | Freq: Once | INTRAMUSCULAR | Status: DC
  Filled 2014-01-25: qty 1

## 2014-01-25 MED ORDER — SODIUM CHLORIDE 0.9 % IV BOLUS (SEPSIS)
1000.0000 mL | Freq: Once | INTRAVENOUS | Status: AC
  Administered 2014-01-25: 1000 mL via INTRAVENOUS

## 2014-01-25 MED ORDER — IOHEXOL 300 MG/ML  SOLN
50.0000 mL | Freq: Once | INTRAMUSCULAR | Status: AC | PRN
  Administered 2014-01-25: 50 mL via ORAL

## 2014-01-25 NOTE — ED Notes (Signed)
Per pt, states abdominal pain for 2 days-history of diverticulitis-recently hospitalized-needs surgery for fistula

## 2014-01-25 NOTE — ED Provider Notes (Signed)
CSN: [id number]     Arrival date & time 01/25/14  1039 History   First MD Initiated Contact with Patient 01/25/14 1052     Chief Complaint  Patient presents with  . Abdominal Pain     (Consider location/radiation/quality/duration/timing/severity/associated sxs/prior Treatment) HPI Comments: Patient presents with lower abdominal pain. 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 She has a gastroenterologist but she seen in she's also been followed by Dr. Maisie Fus, colorectal surgeon. She's is currently on Cipro and Flagyl. She states over the last 2-3 days she's had worsening pain across her lower abdomen, more on the left side. She's also had worsening loose stools with mucus in her stools. She denies any hematochezia or melena. He's had a fever of 201 yesterday. She's had some nausea but no vomiting. She saw her OB/GYN about a week and half ago to remove her IUD which was recommended due to possible fistula. OB/GYN was unable to find the string since she has an appointment next week to use video guidance to remove the IUD. She also has been told that she needs to have surgery to fix this fistula however they're waiting for the inflammation to go down. She saw Dr. Maisie Fus 2 days ago however her pain has gotten worse since that time.  Patient is a 38 y.o. female presenting with abdominal pain.  Abdominal Pain Associated symptoms: diarrhea, fever and nausea   Associated symptoms: no chest pain, no chills, no cough, no fatigue, no hematuria, no shortness of breath and no vomiting     Past Medical History  Diagnosis Date  . Hypertension   . ADD (attention deficit disorder)   . Rosacea   . Vaginal tear resulting from childbirth     3rd degree  . Chronic back pain     on chronic narcotics  . Diverticulitis    Past Surgical History  Procedure Laterality Date  . Wisdom tooth extraction     Family  History  Problem Relation Age of Onset  . Cancer Mother     Colon, Cervical   . Hypertension Father   . Osteoporosis Paternal Grandmother   . Hypertension Paternal Grandmother   . Cancer Paternal Grandfather     Bladder, Throat    History  Substance Use Topics  . Smoking status: Former Smoker -- 0.50 packs/day    Types: Cigarettes  . Smokeless tobacco: Current User     Comment: She is trying E-Cigarrettes to quit smoking   . Alcohol Use: No   OB History   Grav Para Term Preterm Abortions TAB SAB Ect Mult Living                 Review of Systems  Constitutional: Positive for fever. Negative for chills, diaphoresis and fatigue.  HENT: Negative for congestion, rhinorrhea and sneezing.   Eyes: Negative.   Respiratory: Negative for cough, chest tightness and shortness of breath.   Cardiovascular: Negative for chest pain and leg swelling.  Gastrointestinal: Positive for nausea, abdominal pain and diarrhea. Negative for vomiting and blood in stool.  Genitourinary: Negative for frequency, hematuria, flank pain and difficulty urinating.  Musculoskeletal: Negative for arthralgias and back pain.  Skin: Negative for rash.  Neurological: Negative for dizziness, speech difficulty, weakness, numbness and headaches.      Allergies  Review of patient's allergies indicates no known allergies.  Home Medications   Prior to Admission medications   Medication Sig  Start Date End Date Taking? Authorizing Provider  acetaminophen (TYLENOL) 325 MG tablet Take 650 mg by mouth every 6 (six) hours as needed for moderate pain.    Yes Historical Provider, MD  ciprofloxacin (CIPRO) 500 MG tablet Take 500 mg by mouth 2 (two) times daily. 01/10/14 01/31/14 Yes Historical Provider, MD  HYDROcodone-acetaminophen (NORCO) 10-325 MG per tablet Take 1 tablet by mouth 3 (three) times daily.   Yes Historical Provider, MD  lisdexamfetamine (VYVANSE) 40 MG capsule Take 40 mg by mouth daily as needed (focus).   Yes  Historical Provider, MD  methocarbamol (ROBAXIN) 750 MG tablet Take 750 mg by mouth 3 (three) times daily as needed (pain).    Yes Historical Provider, MD  metroNIDAZOLE (FLAGYL) 500 MG tablet Take 500 mg by mouth 3 (three) times daily. 01/10/14 01/31/14 Yes Historical Provider, MD  terbinafine (LAMISIL) 250 MG tablet Take 250 mg by mouth every morning.   Yes Historical Provider, MD  valsartan-hydrochlorothiazide (DIOVAN-HCT) 160-25 MG per tablet Take 1 tablet by mouth every morning.   Yes Historical Provider, MD  traMADol (ULTRAM) 50 MG tablet Take 1 tablet (50 mg total) by mouth every 6 (six) hours as needed. 01/25/14   Rolan Bucco, MD   BP 109/76  Pulse 64  Temp(Src) 97.8 F (36.6 C) (Oral)  Resp 16  SpO2 100% Physical Exam  Constitutional: She is oriented to person, place, and time. She appears well-developed and well-nourished.  HENT:  Head: Normocephalic and atraumatic.  Eyes: Pupils are equal, round, and reactive to light.  Neck: Normal range of motion. Neck supple.  Cardiovascular: Normal rate, regular rhythm and normal heart sounds.   Pulmonary/Chest: Effort normal and breath sounds normal. No respiratory distress. She has no wheezes. She has no rales. She exhibits no tenderness.  Abdominal: Soft. Bowel sounds are normal. There is tenderness (moderate TTP across lower abdomen, L>R). There is no rebound and no guarding.  Musculoskeletal: Normal range of motion. She exhibits no edema.  Lymphadenopathy:    She has no cervical adenopathy.  Neurological: She is alert and oriented to person, place, and time.  Skin: Skin is warm and dry. No rash noted.  Psychiatric: She has a normal mood and affect.    ED Course  Procedures (including critical care time) Labs Review Results for orders placed during the hospital encounter of 01/25/14  CBC WITH DIFFERENTIAL      Result Value Ref Range   WBC 8.6  4.0 - 10.5 K/uL   RBC 3.94  3.87 - 5.11 MIL/uL   Hemoglobin 12.6  12.0 - 15.0 g/dL    HCT 40.9  81.1 - 91.4 %   MCV 93.9  78.0 - 100.0 fL   MCH 32.0  26.0 - 34.0 pg   MCHC 34.1  30.0 - 36.0 g/dL   RDW 78.2  95.6 - 21.3 %   Platelets 249  150 - 400 K/uL   Neutrophils Relative % 60  43 - 77 %   Neutro Abs 5.2  1.7 - 7.7 K/uL   Lymphocytes Relative 30  12 - 46 %   Lymphs Abs 2.6  0.7 - 4.0 K/uL   Monocytes Relative 6  3 - 12 %   Monocytes Absolute 0.5  0.1 - 1.0 K/uL   Eosinophils Relative 4  0 - 5 %   Eosinophils Absolute 0.3  0.0 - 0.7 K/uL   Basophils Relative 0  0 - 1 %   Basophils Absolute 0.0  0.0 - 0.1 K/uL  COMPREHENSIVE METABOLIC PANEL      Result Value Ref Range   Sodium 137  137 - 147 mEq/L   Potassium 3.9  3.7 - 5.3 mEq/L   Chloride 99  96 - 112 mEq/L   CO2 25  19 - 32 mEq/L   Glucose, Bld 92  70 - 99 mg/dL   BUN 9  6 - 23 mg/dL   Creatinine, Ser 1.61  0.50 - 1.10 mg/dL   Calcium 9.5  8.4 - 09.6 mg/dL   Total Protein 6.9  6.0 - 8.3 g/dL   Albumin 3.9  3.5 - 5.2 g/dL   AST 15  0 - 37 U/L   ALT 20  0 - 35 U/L   Alkaline Phosphatase 49  39 - 117 U/L   Total Bilirubin 0.3  0.3 - 1.2 mg/dL   GFR calc non Af Amer >90  >90 mL/min   GFR calc Af Amer >90  >90 mL/min   Anion gap 13  5 - 15  URINALYSIS, ROUTINE W REFLEX MICROSCOPIC      Result Value Ref Range   Color, Urine YELLOW  YELLOW   APPearance CLEAR  CLEAR   Specific Gravity, Urine 1.016  1.005 - 1.030   pH 7.5  5.0 - 8.0   Glucose, UA NEGATIVE  NEGATIVE mg/dL   Hgb urine dipstick NEGATIVE  NEGATIVE   Bilirubin Urine NEGATIVE  NEGATIVE   Ketones, ur NEGATIVE  NEGATIVE mg/dL   Protein, ur NEGATIVE  NEGATIVE mg/dL   Urobilinogen, UA 0.2  0.0 - 1.0 mg/dL   Nitrite NEGATIVE  NEGATIVE   Leukocytes, UA SMALL (*) NEGATIVE  URINE MICROSCOPIC-ADD ON      Result Value Ref Range   Squamous Epithelial / LPF RARE  RARE   WBC, UA 3-6  <3 WBC/hpf   Bacteria, UA RARE  RARE  POC URINE PREG, ED      Result Value Ref Range   Preg Test, Ur NEGATIVE  NEGATIVE   Ct Abdomen Pelvis W Contrast  01/25/2014    CLINICAL DATA:  Left lower quadrant pain, diarrhea, history of diverticulitis. 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  EXAM: CT ABDOMEN AND PELVIS WITH CONTRAST  TECHNIQUE: Multidetector CT imaging of the abdomen and pelvis was performed using the standard protocol following bolus administration of intravenous contrast.  CONTRAST:  50mL OMNIPAQUE IOHEXOL 300 MG/ML SOLN, OMNIPAQUE IOHEXOL 300 MG/ML SOLN  COMPARISON:  CT of the abdomen pelvis December 11, 2013 and December 05, 2013 and December 24, 2013  FINDINGS: LUNG BASES: Included view of the lung bases are clear. Visualized heart and pericardium are unremarkable.  SOLID ORGANS: The liver, spleen, gallbladder, pancreas and adrenal glands are unremarkable.  GASTROINTESTINAL TRACT: Sigmoid diverticulosis with superimposed inflammatory changes consistent with diverticulitis. Bubble of contained extraluminal gas again noted The stomach, small bowel are normal in course and caliber without inflammatory changes. Normal appendix.  KIDNEYS/ URINARY TRACT: Kidneys are orthotopic. Focal hypoenhancement in the anterior cortex of the right interpolar kidney at prior site of pyelonephritis most consistent with scarring. No CT findings of recurrent pyelonephritis. No nephrolithiasis, hydronephrosis or renal masses. The unopacified ureters are normal in course and caliber. Urinary bladder is partially distended with new abnormal heterogeneous enhancing bladder dome thickening to 11 mm.  PERITONEUM/RETROPERITONEUM: Small amount of free fluid in the pelvis, no abscess. Aortoiliac vessels are normal in course and caliber. No lymphadenopathy by CT size criteria, subcentimeter round lymph nodes in the pelvis are likely reactive measuring up to  6 mm. Intrauterine device in place, unchanged. Similar apparent cervical/vaginal wall thickness with air in the left vaginal fornix, nonspecific.  SOFT TISSUE/OSSEOUS STRUCTURES: Nonsuspicious. Moderate L5-S1 degenerative disc disease.  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 Free fluid in the pelvis, with small lymph nodes are likely reactive.  New focal bladder wall thickening concerning for cystitis, recommend clinical correlation and follow-up.  Similar apparent cervical/vaginal wall thickness with air in the left vaginal fornix, nonspecific.  Right renal scarring at site of prior pyelonephritis.   Electronically Signed   By: Awilda Metro   On: 01/25/2014 14:29   Ct Abdomen Pelvis W Contrast  01/10/2014   ADDENDUM REPORT: 01/10/2014 12:29  ADDENDUM: These results were called by telephone at the time of interpretation on 01/10/2014 at 12:29 pm to [facility] , who verbally acknowledged these results.   Electronically Signed   By: Malachy Moan M.D.   On: 01/10/2014 12:29   01/10/2014   CLINICAL DATA:  History of diverticulitis with persistent abdominal pain and possible colovaginal fistula  EXAM: CT ABDOMEN AND PELVIS WITH CONTRAST  TECHNIQUE: Multidetector CT imaging of the abdomen and pelvis was performed using the standard protocol following bolus administration of intravenous contrast.  CONTRAST:  OMNIPAQUE IOHEXOL 300 MG/ML  SOLN  COMPARISON:  Prior CT abdomen/ pelvis 01/04/2014 and 12/24/2013  FINDINGS: Lower Chest: The lung bases are clear. Visualized cardiac structures are within normal limits for size. No pericardial effusion. Unremarkable visualized distal thoracic esophagus.  Abdomen: Unremarkable CT appearance of the stomach, duodenum, spleen, adrenal glands and pancreas. Normal hepatic contours and morphology. No discrete hepatic lesion. Gallbladder is unremarkable. No intra or extrahepatic biliary ductal dilatation.  Unremarkable appearance of the bilateral kidneys. No focal solid lesion, hydronephrosis or nephrolithiasis. Complete resolution of the prior findings of right-sided pyelonephritis compared to the more recent prior CT scans.   Continued improvement in inflammatory change at the site of prior active diverticulitis in the heavily redundant sigmoid colon. However, there is a persistent tract of inflammatory stranding and fluid containing tiny locules of extra luminal gas extending from the sigmoid colon toward the left posterolateral aspect of the vaginal fornix at the 5 o'clock position. No definite communication can be identified by CT. Stable sigmoid and perirectal mesenteric lymph nodes. An index sigmoid mesocolon node measures 6 mm in short axis on image 67 of series 2. This is unchanged compared to prior. A perirectal lymph node measures 6 mm in short axis as well which may be incrementally increased compared to 5 mm previously. Subtle differences favored to be related to slice selection.  Pelvis: IUD well position within the endometrial canal. The uterus itself is unremarkable. Unremarkable at adnexa for reproductive age female. Normal appearance of the bladder. Stranding and thickening about the left posterolateral aspect of the vaginal fornix. Gas is present within the vagina, however this is nonspecific.  Bones/Soft Tissues: No acute fracture or aggressive appearing lytic or blastic osseous lesion.  Vascular: No significant atherosclerotic vascular disease, aneurysmal dilatation or acute abnormality.  IMPRESSION: 1. Linear tract of extraluminal stranding, fluid and tiny locular of gas extending from the sigmoid colon to the left posterolateral aspect of vaginal fornix highly concerning for colovaginal fistula consistent with the patient's clinical symptoms. 2. An IUD is present. Currently, there is no inflammatory change surrounding the uterus itself. However, consideration should be given to removal of the IUD as the presence of this foreign body in the  setting of a colovaginal fistula may increase the risk of developing endometritis. 3. 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 Overall, the degree of inflammation involving the sigmoid colon appears slightly improved. There is no drainable fluid collection. 4. Stable sigmoid mesocolon and perirectal lymph nodes. Although these are likely reactive and related to the chronic inflammation of the colovaginal fistula, the possibility of a underlying colonic neoplasm is difficult to exclude radiographically. 5. Interval resolution of right pyelonephritis compared to prior imaging.  Electronically Signed: By: Malachy Moan M.D. On: 01/10/2014 12:02   Ct Abdomen Pelvis W Contrast  01/04/2014   CLINICAL DATA:  Low back pain with weakness and fever. Diagnosed with kidney infection treated with antibiotics with limited response.  EXAM: CT ABDOMEN AND PELVIS WITH CONTRAST  TECHNIQUE: Multidetector CT imaging of the abdomen and pelvis was performed using the standard protocol following bolus administration of intravenous contrast.  CONTRAST:  50mL OMNIPAQUE IOHEXOL 300 MG/ML SOLN, OMNIPAQUE IOHEXOL 300 MG/ML SOLN  COMPARISON:  Prior examinations 04/03/2013 and 12/24/2013.  FINDINGS: The lung bases are clear. There is no pleural or pericardial effusion.  There has been interval improvement in the previously demonstrated asymmetric perinephric soft tissue stranding and ill-defined fluid on the right. Likewise, there is no longer any significant focal abnormality within the right kidney. There is no hydronephrosis or evidence of urinary tract calculus. The left kidney appears normal. The bladder appears normal.  The liver, spleen, gallbladder, pancreas and adrenal glands appear normal.  No significant vascular findings are demonstrated. Specifically, the right renal vein appears normal. The left renal vein is retro aortic.  The stomach, small bowel, appendix and proximal colon appear normal. There is persistent diffuse sigmoid colon wall thickening with suspicion of a  small amount of extraluminal air and fluid lateral to the sigmoid colon (images 62-66). The enteric contrast does not yet passed through this region. No drainable fluid collection, bowel obstruction or free intraperitoneal air is seen.  The uterus and ovaries appear stable. Intrauterine device is present.  There are no worrisome osseous findings.  IMPRESSION: 1. Interval improvement in recently demonstrated changes of right-sided pyelonephritis. No evidence of renal or perinephric abscess. No evidence of ureteral obstruction. 2. Suspicion of recurrent sigmoid colon diverticulitis with small amount of extraluminal air and fluid. No drainable abscess. 3. No evidence of bowel obstruction.   Electronically Signed   By: Roxy Horseman M.D.   On: 01/04/2014 18:39      Imaging Review Ct Abdomen Pelvis W Contrast  01/25/2014   CLINICAL DATA:  Left lower quadrant pain, diarrhea, history of diverticulitis. 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  EXAM: CT ABDOMEN AND PELVIS WITH CONTRAST  TECHNIQUE: Multidetector CT imaging of the abdomen and pelvis was performed using the standard protocol following bolus administration of intravenous contrast.  CONTRAST:  50mL OMNIPAQUE IOHEXOL 300 MG/ML SOLN, OMNIPAQUE IOHEXOL 300 MG/ML SOLN  COMPARISON:  CT of the abdomen pelvis December 11, 2013 and December 05, 2013 and December 24, 2013  FINDINGS: LUNG BASES: Included view of the lung bases are clear. Visualized heart and pericardium are unremarkable.  SOLID ORGANS: The liver, spleen, gallbladder, pancreas and adrenal glands are unremarkable.  GASTROINTESTINAL TRACT: Sigmoid diverticulosis with superimposed inflammatory changes consistent with diverticulitis. Bubble of contained extraluminal gas again noted The stomach, small bowel are normal in course and caliber without inflammatory changes. Normal appendix.  KIDNEYS/  URINARY TRACT: Kidneys are orthotopic. Focal hypoenhancement in the anterior cortex of the right interpolar kidney at  prior site of pyelonephritis most consistent with scarring. No CT findings of recurrent pyelonephritis. No nephrolithiasis, hydronephrosis or renal masses. The unopacified ureters are normal in course and caliber. Urinary bladder is partially distended with new abnormal heterogeneous enhancing bladder dome thickening to 11 mm.  PERITONEUM/RETROPERITONEUM: Small amount of free fluid in the pelvis, no abscess. Aortoiliac vessels are normal in course and caliber. No lymphadenopathy by CT size criteria, subcentimeter round lymph nodes in the pelvis are likely reactive measuring up to 6 mm. Intrauterine device in place, unchanged. Similar apparent cervical/vaginal wall thickness with air in the left vaginal fornix, nonspecific.  SOFT TISSUE/OSSEOUS STRUCTURES: Nonsuspicious. Moderate L5-S1 degenerative disc disease.  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 Free fluid in the pelvis, with small lymph nodes are likely reactive.  New focal bladder wall thickening concerning for cystitis, recommend clinical correlation and follow-up.  Similar apparent cervical/vaginal wall thickness with air in the left vaginal fornix, nonspecific.  Right renal scarring at site of prior pyelonephritis.   Electronically Signed   By: Awilda Metroourtnay  Bloomer   On: 01/25/2014 14:29     EKG Interpretation None      MDM   Final diagnoses:  Diverticulitis of intestine with perforation without bleeding    12:39, spoke with DR. Maisie Fushomas who recommends repeating the CT scan.  15:06: CT scan does not show any significant changes compared to her last PM. There's no worsening infection. There is some inflammation of the bladder wall however her urinalysis did not suggest cystitis. I did send her urine for culture as she is already on Cipro. Her pain is controlled in the ED. Tresa EndoKelly, the [facility] had spoken to Dr. Maisie Fushomas and is recommend  that she be discharged home. Patient is comfortable with this plan. She has an appointment on Monday to followup with Dr. Laural BenesJohnson, her gastroenterologist. Her surgery with Dr. Maisie Fushomas is not scheduled until the end of September. Tresa EndoKelly has recommended that she stay on the Cipro and Flagyl until her surgery even if it's 2 months from now. I did advise the patient of these recommendations and that she will need to call Dr. Maurine Ministerhomas's office if she needs refills.  Rolan BuccoMelanie Mirjana Tarleton, MD 01/25/14 [phone]

## 2014-01-25 NOTE — Discharge Instructions (Signed)

## 2014-01-25 NOTE — ED Notes (Signed)
Bed: [facility] Expected date:  Expected time:  Means of arrival:  Comments: 

## 2014-01-25 NOTE — ED Notes (Signed)
Pt A&Ox4. Moving all extremities equally. Aware urine sample is needed. In NAD.

## 2014-01-25 NOTE — ED Notes (Signed)
Pt to CT scan.

## 2014-01-25 NOTE — ED Notes (Signed)
Patient is encouraged to void when able for a urine sample. Patient is given water to drink. Patient will attempt to void once water is drank.

## 2014-01-27 LAB — URINE CULTURE
Colony Count: NO GROWTH
Culture: NO GROWTH
Special Requests: NORMAL

## 2014-01-29 ENCOUNTER — Other Ambulatory Visit: Payer: Self-pay | Admitting: Gastroenterology

## 2014-02-13 ENCOUNTER — Encounter (HOSPITAL_COMMUNITY): Payer: Self-pay | Admitting: Pharmacy Technician

## 2014-02-15 ENCOUNTER — Encounter (HOSPITAL_COMMUNITY): Payer: Self-pay | Admitting: *Deleted

## 2014-02-26 ENCOUNTER — Emergency Department (HOSPITAL_COMMUNITY): Payer: Medicaid Other

## 2014-02-26 ENCOUNTER — Emergency Department (HOSPITAL_COMMUNITY)
Admission: EM | Admit: 2014-02-26 | Discharge: 2014-02-26 | Disposition: A | Discharge status: Home / Self Care | Payer: Medicaid Other | Attending: Emergency Medicine | Admitting: Emergency Medicine

## 2014-02-26 ENCOUNTER — Encounter (HOSPITAL_COMMUNITY): Payer: Self-pay | Admitting: Emergency Medicine

## 2014-02-26 DIAGNOSIS — Z87891 Personal history of nicotine dependence: Secondary | ICD-10-CM | POA: Diagnosis not present

## 2014-02-26 DIAGNOSIS — R110 Nausea: Secondary | ICD-10-CM | POA: Insufficient documentation

## 2014-02-26 DIAGNOSIS — G8929 Other chronic pain: Secondary | ICD-10-CM | POA: Diagnosis not present

## 2014-02-26 DIAGNOSIS — Z792 Long term (current) use of antibiotics: Secondary | ICD-10-CM | POA: Insufficient documentation

## 2014-02-26 DIAGNOSIS — Z79899 Other long term (current) drug therapy: Secondary | ICD-10-CM | POA: Diagnosis not present

## 2014-02-26 DIAGNOSIS — F909 Attention-deficit hyperactivity disorder, unspecified type: Secondary | ICD-10-CM | POA: Insufficient documentation

## 2014-02-26 DIAGNOSIS — I10 Essential (primary) hypertension: Secondary | ICD-10-CM | POA: Diagnosis not present

## 2014-02-26 DIAGNOSIS — Z872 Personal history of diseases of the skin and subcutaneous tissue: Secondary | ICD-10-CM | POA: Insufficient documentation

## 2014-02-26 DIAGNOSIS — K5792 Diverticulitis of intestine, part unspecified, without perforation or abscess without bleeding: Secondary | ICD-10-CM

## 2014-02-26 DIAGNOSIS — N390 Urinary tract infection, site not specified: Secondary | ICD-10-CM | POA: Diagnosis not present

## 2014-02-26 DIAGNOSIS — N898 Other specified noninflammatory disorders of vagina: Secondary | ICD-10-CM | POA: Insufficient documentation

## 2014-02-26 DIAGNOSIS — R1032 Left lower quadrant pain: Secondary | ICD-10-CM | POA: Insufficient documentation

## 2014-02-26 DIAGNOSIS — K5732 Diverticulitis of large intestine without perforation or abscess without bleeding: Secondary | ICD-10-CM | POA: Insufficient documentation

## 2014-02-26 LAB — CBC WITH DIFFERENTIAL/PLATELET
Basophils Absolute: 0 10*3/uL (ref 0.0–0.1)
Basophils Relative: 0 % (ref 0–1)
EOS PCT: 0 % (ref 0–5)
Eosinophils Absolute: 0 10*3/uL (ref 0.0–0.7)
HCT: 35.8 % — ABNORMAL LOW (ref 36.0–46.0)
HEMOGLOBIN: 12.2 g/dL (ref 12.0–15.0)
Lymphocytes Relative: 14 % (ref 12–46)
Lymphs Abs: 1.6 10*3/uL (ref 0.7–4.0)
MCH: 32.9 pg (ref 26.0–34.0)
MCHC: 34.1 g/dL (ref 30.0–36.0)
MCV: 96.5 fL (ref 78.0–100.0)
MONOS PCT: 4 % (ref 3–12)
Monocytes Absolute: 0.4 10*3/uL (ref 0.1–1.0)
NEUTROS ABS: 9.5 10*3/uL — AB (ref 1.7–7.7)
Neutrophils Relative %: 82 % — ABNORMAL HIGH (ref 43–77)
Platelets: 168 10*3/uL (ref 150–400)
RBC: 3.71 MIL/uL — ABNORMAL LOW (ref 3.87–5.11)
RDW: 13.6 % (ref 11.5–15.5)
WBC: 11.5 10*3/uL — ABNORMAL HIGH (ref 4.0–10.5)

## 2014-02-26 LAB — I-STAT TROPONIN, ED: Troponin i, poc: 0.01 ng/mL (ref 0.00–0.08)

## 2014-02-26 LAB — URINALYSIS, ROUTINE W REFLEX MICROSCOPIC
BILIRUBIN URINE: NEGATIVE
Glucose, UA: NEGATIVE mg/dL
KETONES UR: 15 mg/dL — AB
Nitrite: NEGATIVE
PROTEIN: NEGATIVE mg/dL
Specific Gravity, Urine: 1.004 — ABNORMAL LOW (ref 1.005–1.030)
Urobilinogen, UA: 0.2 mg/dL (ref 0.0–1.0)
pH: 7.5 (ref 5.0–8.0)

## 2014-02-26 LAB — COMPREHENSIVE METABOLIC PANEL
ALT: 15 U/L (ref 0–35)
ANION GAP: 15 (ref 5–15)
AST: 12 U/L (ref 0–37)
Albumin: 3.2 g/dL — ABNORMAL LOW (ref 3.5–5.2)
Alkaline Phosphatase: 43 U/L (ref 39–117)
BUN: 7 mg/dL (ref 6–23)
CALCIUM: 7.6 mg/dL — AB (ref 8.4–10.5)
CHLORIDE: 111 meq/L (ref 96–112)
CO2: 17 meq/L — AB (ref 19–32)
CREATININE: 0.45 mg/dL — AB (ref 0.50–1.10)
GFR calc Af Amer: >90 mL/min (ref >90)
Glucose, Bld: 93 mg/dL (ref 70–99)
Potassium: 3.2 mEq/L — ABNORMAL LOW (ref 3.7–5.3)
Sodium: 143 mEq/L (ref 137–147)
Total Bilirubin: <0.2 mg/dL — ABNORMAL LOW (ref 0.3–1.2)
Total Protein: 5.9 g/dL — ABNORMAL LOW (ref 6.0–8.3)

## 2014-02-26 LAB — LIPASE, BLOOD: LIPASE: 24 U/L (ref 11–59)

## 2014-02-26 LAB — WET PREP, GENITAL
CLUE CELLS WET PREP: NONE SEEN
TRICH WET PREP: NONE SEEN
Yeast Wet Prep HPF POC: NONE SEEN

## 2014-02-26 LAB — URINE MICROSCOPIC-ADD ON

## 2014-02-26 MED ORDER — CEPHALEXIN 500 MG PO CAPS: 1000.0000 mg | ORAL_CAPSULE | Freq: Two times a day (BID) | ORAL | Status: DC

## 2014-02-26 MED ORDER — HYDROMORPHONE HCL PF 1 MG/ML IJ SOLN
1.0000 mg | Freq: Once | INTRAMUSCULAR | Status: AC
  Administered 2014-02-26: 1 mg via INTRAVENOUS
  Filled 2014-02-26: qty 1

## 2014-02-26 MED ORDER — SODIUM CHLORIDE 0.9 % IV BOLUS (SEPSIS)
1000.0000 mL | Freq: Once | INTRAVENOUS | Status: AC
  Administered 2014-02-26: 1000 mL via INTRAVENOUS

## 2014-02-26 MED ORDER — ONDANSETRON HCL 4 MG/2ML IJ SOLN
4.0000 mg | Freq: Once | INTRAMUSCULAR | Status: AC
  Administered 2014-02-26: 4 mg via INTRAVENOUS
  Filled 2014-02-26: qty 2

## 2014-02-26 MED ORDER — IOHEXOL 300 MG/ML  SOLN
100.0000 mL | Freq: Once | INTRAMUSCULAR | Status: AC | PRN
  Administered 2014-02-26: 100 mL via INTRAVENOUS

## 2014-02-26 MED ORDER — DEXTROSE 5 % IV SOLN
1.0000 g | Freq: Once | INTRAVENOUS | Status: AC
  Administered 2014-02-26: 1 g via INTRAVENOUS
  Filled 2014-02-26: qty 10

## 2014-02-26 NOTE — ED Notes (Signed)
Bed: [facility] Expected date:  Expected time:  Means of arrival:  Comments: [id number] pain

## 2014-02-26 NOTE — ED Notes (Addendum)
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 Treated for kidney infection related to this. Treated x3 for diverticulitis infection. Needs colon resection surgery but is currently taking Flagyl and cipro. Reports having normal BMs up until recently and states "I've probably gone to the bathroom 15 times today. I couldn't have a good bowel movement the three days before that so I'm worried I have an obstruction. It's almost like a clear mucous. I haven't noticed any stool from my vagina at this time." Also in the past 3 weeks has started having panic attacks related to dealing with her declining health and her parents' declining health. Reports dizziness and extremity tingling/numbness when this happens. Took Amytryptiline 25 mg today. A&Ox4. Moving all extremities equally. Awaiting MD/PA.

## 2014-02-26 NOTE — ED Notes (Signed)
Pt knows to follow up with GI and take all currently prescribed antibiotics and previously prescribed antibiotics. No other questions/concerns. Ambulatory with avs in hand.

## 2014-02-26 NOTE — ED Notes (Signed)
Patient transported to CT 

## 2014-02-26 NOTE — Discharge Instructions (Signed)
You have evidence of diverticulitis however there is no abscess. Continue with your colonoscopy as scheduled for this week. You also have evidence of a urinary tract infection. Take antibiotic as prescribed. Eat yogurt on a regular basis to decrease the risk of having diarrhea. Follow up with your doctor for further care.  Return if you have any concerns or if your symptoms worsen.  Diverticulitis Diverticulitis is when small pockets that have formed in your colon (large intestine) become infected or swollen. HOME CARE  Follow your doctor's instructions.  Follow a special diet if told by your doctor.  When you feel better, your doctor may tell you to change your diet. You may be told to eat a lot of fiber. Fruits and vegetables are good sources of fiber. Fiber makes it easier to poop (have bowel movements).  Take supplements or probiotics as told by your doctor.  Only take medicines as told by your doctor.  Keep all follow-up visits with your doctor. GET HELP IF:  Your pain does not get better.  You have a hard time eating food.  You are not pooping like normal. GET HELP RIGHT AWAY IF:  Your pain gets worse.  Your problems do not get better.  Your problems suddenly get worse.  You have a fever.  You keep throwing up (vomiting).  You have bloody or black, tarry poop (stool). MAKE SURE YOU:   Understand these instructions.  Will watch your condition.  Will get help right away if you are not doing well or get worse. Document Released: 12/02/2007 Document Revised: 06/20/2013 Document Reviewed: 05/10/2013 [facility] Patient Information 2015 [place], [place]. This information is not intended to replace advice given to you by your health care provider. Make sure you discuss any questions you have with your health care provider.  Urinary Tract Infection A urinary tract infection (UTI) can occur any place along the urinary tract. The tract includes the kidneys, ureters, bladder,  and urethra. A type of germ called bacteria often causes a UTI. UTIs are often helped with antibiotic medicine.  HOME CARE   If given, take antibiotics as told by your doctor. Finish them even if you start to feel better.  Drink enough fluids to keep your pee (urine) clear or pale yellow.  Avoid tea, drinks with caffeine, and bubbly (carbonated) drinks.  Pee often. Avoid holding your pee in for a long time.  Pee before and after having sex (intercourse).  Wipe from front to back after you poop (bowel movement) if you are a woman. Use each tissue only once. GET HELP RIGHT AWAY IF:   You have back pain.  You have lower belly (abdominal) pain.  You have chills.  You feel sick to your stomach (nauseous).  You throw up (vomit).  Your burning or discomfort with peeing does not go away.  You have a fever.  Your symptoms are not better in 3 days. MAKE SURE YOU:   Understand these instructions.  Will watch your condition.  Will get help right away if you are not doing well or get worse. Document Released: 12/02/2007 Document Revised: 03/09/2012 Document Reviewed: 01/14/2012 [facility] Patient Information 2015 [place], [place]. This information is not intended to replace advice given to you by your health care provider. Make sure you discuss any questions you have with your health care provider.

## 2014-02-26 NOTE — ED Provider Notes (Signed)
CSN: [id number]     Arrival date & time 02/26/14  1546 History   First MD Initiated Contact with Patient 02/26/14 1720     Chief Complaint  Patient presents with  . Abdominal Pain  . 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  Patient states for the past for 5 days she has had persistent right lower quadrant abdominal pain which she described as a sharp and stabbing sensation, waxing waning and minimally improved with her pain medication at home. She endorsed chills, and feeling nauseous. She has had normal bowel movement but today she has had more than 15 bouts of soft stool with mucus but no blood. No prior history of C. difficile. She also endorsed mild burning on urination and urinary cramping. She has been taking Cipro and Flagyl for the past one month and a half. She has an IUD that need to be removed and is scheduled to be removed sometime later in the week. She is also being cared for by [facility], Dr. Maisie Fus who will operate on her once her diverticulitis improves.  At this time she worries about her worsening abdominal pain and concerns of complication. She has had trouble with colovaginal fistula and report noticing feces through her vagina in the past, however that has resolved.   Past Medical History  Diagnosis Date  . Hypertension   . ADD (attention deficit disorder)   . Rosacea   . Vaginal tear resulting from childbirth     3rd degree  . Chronic back pain     on chronic narcotics  . Diverticulitis   . Fistula     from sigmoid colon to vagina now, leaks stool for surgery  to repair asap   Past Surgical History  Procedure Laterality Date  . Wisdom tooth extraction     Family History  Problem Relation Age of Onset  . Cancer Mother     Colon, Cervical   . Hypertension Father   . Osteoporosis Paternal Grandmother   . Hypertension Paternal Grandmother   . Cancer Paternal Grandfather     Bladder, Throat    History  Substance Use Topics  . Smoking status: Former Smoker -- 0.50 packs/day for 15 years    Types: Cigarettes    Quit date: 12/27/2013  . Smokeless tobacco: Current User     Comment: She is trying E-Cigarrettes to quit smoking   . Alcohol Use: No   OB History   Grav Para Term Preterm Abortions TAB SAB Ect Mult Living                 Review of Systems  All other systems reviewed and are negative.     Allergies  Review of patient's allergies indicates no known allergies.  Home Medications   Prior to Admission medications   Medication Sig Start Date End Date Taking? Authorizing Provider  amitriptyline (ELAVIL) 25 MG tablet Take 25-100 mg by mouth at bedtime.   Yes Historical Provider, MD  ciprofloxacin (CIPRO) 500 MG tablet Take 500 mg by mouth 2 (two) times daily.   Yes Historical Provider, MD  HYDROcodone-acetaminophen (NORCO) 10-325 MG per tablet Take 2 tablets by mouth 3 (three)  times daily.    Yes Historical Provider, MD  ketoconazole (NIZORAL) 2 % cream Apply 1 application topically once as needed for irritation.   Yes Historical Provider, MD  lisdexamfetamine (VYVANSE) 40 MG capsule Take 40 mg by mouth daily as needed (focus).   Yes Historical Provider, MD  methocarbamol (ROBAXIN) 750 MG tablet Take 750 mg by mouth 3 (three) times daily as needed (pain).    Yes Historical Provider, MD  metroNIDAZOLE (FLAGYL) 500 MG tablet Take 500 mg by mouth 3 (three) times daily.   Yes Historical Provider, MD  terbinafine (LAMISIL) 250 MG tablet Take 250 mg by mouth every morning.   Yes Historical Provider, MD  traMADol (ULTRAM) 50 MG tablet  Take 1 tablet (50 mg total) by mouth every 6 (six) hours as needed. 01/25/14  Yes Rolan Bucco, MD  valsartan-hydrochlorothiazide (DIOVAN-HCT) 160-25 MG per tablet Take 1 tablet by mouth every morning.   Yes Historical Provider, MD   BP 140/98  Pulse 92  Temp(Src) 98.4 F (36.9 C) (Oral)  Resp 18  SpO2 100% Physical Exam  Nursing note and vitals reviewed. Constitutional: She is oriented to person, place, and time. She appears well-developed and well-nourished. No distress.  HENT:  Head: Atraumatic.  Eyes: Conjunctivae are normal.  Neck: Neck supple.  Cardiovascular: Normal rate and regular rhythm.   Pulmonary/Chest: Effort normal and breath sounds normal.  Abdominal: Soft. There is tenderness (Diffuse abdominal tenderness most significant to left lower quadrant with guarding but without rebound tenderness. Abdomen is soft, nondistended, hypoactive bowel sounds). Hernia confirmed negative in the right inguinal area and confirmed negative in the left inguinal area.  Genitourinary: Vagina normal. There is no tenderness or lesion on the right labia. There is no tenderness or lesion on the left labia. Cervix exhibits discharge and friability. Cervix exhibits no motion tenderness. Right adnexum displays tenderness. Left adnexum displays tenderness. Left adnexum displays no fullness.  Chaperone present:    Lymphadenopathy:       Right: No inguinal adenopathy present.       Left: No inguinal adenopathy present.  Neurological: She is alert and oriented to person, place, and time.  Skin: No rash noted.  Psychiatric: She has a normal mood and affect.    ED Course  Procedures (including critical care time)  5:51 PM Pt with hx of diverticulitis, colovaginal fistula has been on cipro/flagyl for 1.5 months is here with worsening abd pain, and persistent loose stool. Given her significant hx, plan to obtain acute abd series to r/o sbo or free air.  UA with evidence of UTI, rocephin started.  Will  perform pelvic exam, and may consider abd/pelvic CT as part of evaluation.    6:49 PM Mild Diffused tenderness to both adnexa on pelvic examination, no evidence of fistula, unable to visualize IUD string.  UA with evidence of UTI.  Acute abdominal series showing abnormal but nonspecific bowel gas pattern with air-fluid leves in nondilated loops of small bowel suggesting either ileious or enteritis with SBO less likely.  No free air.    Labs Review Labs Reviewed  WET PREP, GENITAL - Abnormal; Notable for the following:    WBC, Wet Prep HPF POC MANY (*)    All other components within normal limits  CBC WITH DIFFERENTIAL - Abnormal; Notable for the following:    WBC 11.5 (*)    RBC 3.71 (*)    HCT 35.8 (*)    Neutrophils Relative % 82 (*)    Neutro Abs 9.5 (*)  All other components within normal limits  COMPREHENSIVE METABOLIC PANEL - Abnormal; Notable for the following:    Potassium 3.2 (*)    CO2 17 (*)    Creatinine, Ser 0.45 (*)    Calcium 7.6 (*)    Total Protein 5.9 (*)    Albumin 3.2 (*)    Total Bilirubin <0.2 (*)    All other components within normal limits  URINALYSIS, ROUTINE W REFLEX MICROSCOPIC - Abnormal; Notable for the following:    APPearance CLOUDY (*)    Specific Gravity, Urine 1.004 (*)    Hgb urine dipstick TRACE (*)    Ketones, ur 15 (*)    Leukocytes, UA MODERATE (*)    All other components within normal limits  CLOSTRIDIUM DIFFICILE BY PCR  GC/CHLAMYDIA PROBE AMP  LIPASE, BLOOD  URINE MICROSCOPIC-ADD ON  I-STAT TROPOININ, ED    Imaging Review Ct Abdomen Pelvis W Contrast  02/26/2014   CLINICAL DATA:  ABDOMINAL PAIN ANXIETY multiple abd surgery, hx of colvaginal fistula, prior diverticulitis  EXAM: CT ABDOMEN AND PELVIS WITH CONTRAST  TECHNIQUE: Multidetector CT imaging of the abdomen and pelvis was performed using the standard protocol following bolus administration of intravenous contrast.  CONTRAST:  OMNIPAQUE IOHEXOL 300 MG/ML  SOLN   COMPARISON:  01/25/2014 and earlier studies  FINDINGS: Visualized lung bases clear. Unremarkable liver, gallbladder, spleen, adrenal glands, kidneys, pancreas, aorta, portal vein. Retroaortic left renal vein, an anatomic variant. Stomach, small bowel, and colon are nondilated. Normal appendix. Scattered sigmoid diverticula. There is persistent wall thickening in the mid and distal sigmoid colon with mild inflammatory/ edematous changes in the adjacent pericolic fat just above the rectosigmoid junction. IUD is in place. Uterus and adnexal regions otherwise unremarkable. Negative for abscess. Urinary bladder is physiologically distended, with resolution of the eccentric wall thickening seen previously. No free air. No ascites. No adenopathy localized. Lumbar spine unremarkable.  IMPRESSION: 1. Chronic distal sigmoid diverticulitis as before without abscess.   Electronically Signed   By: Oley Balm M.D.   On: 02/26/2014 21:22   Dg Abd Acute W/chest  02/26/2014   CLINICAL DATA:  Lower abdominal pain. Diverticulitis. Perforation with history of fistula.  EXAM: ACUTE ABDOMEN SERIES (ABDOMEN 2 VIEW & CHEST 1 VIEW)  COMPARISON:  01/25/2014  FINDINGS: The lungs appear clear.  Cardiac and mediastinal contours normal.  No pleural effusion identified.  No free intraperitoneal gas collects beneath the hemidiaphragms. There are abnormal air-fluid levels in nondilated loops of small bowel in the right upper quadrant and left abdomen. There is a paucity of gas and stool in the colon.  T-shaped IUD projects over the anatomic pelvis.  IMPRESSION: 1. Abnormal but nonspecific bowel gas pattern with air-fluid levels in nondilated loops of small bowel. This could be from a ileus or enteritis. Given the lack of dilated bowel, obstruction is considered less likely. No radiographic findings of free intraperitoneal gas at this time.   Electronically Signed   By: Herbie Baltimore M.D.   On: 02/26/2014 18:09     EKG  Interpretation None      MDM   Final diagnoses:  UTI (lower urinary tract infection)  Diverticulitis of intestine without perforation or abscess without bleeding    BP 157/87  Pulse 88  Temp(Src) 98.4 F (36.9 C) (Oral)  Resp 15  SpO2 100%  I have reviewed nursing notes and vital signs. I personally reviewed the imaging tests through PACS system  I reviewed available ER/hospitalization records thought the EMR  Fayrene Helper, PA-C 02/26/14 2227

## 2014-02-26 NOTE — ED Notes (Signed)
Patient transported to X-ray 

## 2014-02-26 NOTE — ED Notes (Signed)
Per EMS- from home, c/o LLQ pain rating 7/10 since Friday. Tenderness noted. No Durfey of pregnancy. Significantly worse today. Was dull and aching, now sharp. Recently hospitalized and had perforated bowel (past). CT scan recently showed GI fistula. 03/01/14 scheduled for colonoscopy. Also needs IUD removed. Reports recent infection-receiving Flagyl. Missed a few doses. Attempt to give abx and decrease infection to do surgery. C/o nausea with no vomiting. Loose stool noted today without abnormal color. Ate normal meal last night. VS: BP 133/83 HR 121 SpO2 100% on RA. Hx anxiety (gets numbness/tingling in hands, feet, face).   18 G RAC. ST on monitor.

## 2014-02-27 LAB — GC/CHLAMYDIA PROBE AMP
CT Probe RNA: NEGATIVE
GC Probe RNA: NEGATIVE

## 2014-02-27 NOTE — ED Provider Notes (Signed)
Medical screening examination/treatment/procedure(s) were performed by non-physician practitioner and as supervising physician I was immediately available for consultation/collaboration.  Nahla Lukin L Leanor Voris, MD 02/27/14 0028 

## 2014-03-01 ENCOUNTER — Encounter (HOSPITAL_COMMUNITY): Payer: Self-pay | Admitting: Anesthesiology

## 2014-03-01 ENCOUNTER — Encounter (HOSPITAL_COMMUNITY): Payer: Medicaid Other | Admitting: Registered Nurse

## 2014-03-01 ENCOUNTER — Ambulatory Visit (HOSPITAL_COMMUNITY)
Admission: RE | Admit: 2014-03-01 | Discharge: 2014-03-01 | Disposition: A | Discharge status: Home / Self Care | Payer: Medicaid Other | Source: Ambulatory Visit | Attending: Gastroenterology | Admitting: Gastroenterology

## 2014-03-01 ENCOUNTER — Ambulatory Visit (HOSPITAL_COMMUNITY): Payer: Medicaid Other | Admitting: Registered Nurse

## 2014-03-01 ENCOUNTER — Encounter (HOSPITAL_COMMUNITY)
Admission: RE | Disposition: A | Discharge status: Home / Self Care | Payer: Self-pay | Source: Ambulatory Visit | Attending: Gastroenterology

## 2014-03-01 DIAGNOSIS — K5730 Diverticulosis of large intestine without perforation or abscess without bleeding: Secondary | ICD-10-CM | POA: Insufficient documentation

## 2014-03-01 DIAGNOSIS — K5732 Diverticulitis of large intestine without perforation or abscess without bleeding: Secondary | ICD-10-CM | POA: Diagnosis present

## 2014-03-01 DIAGNOSIS — I10 Essential (primary) hypertension: Secondary | ICD-10-CM | POA: Diagnosis not present

## 2014-03-01 DIAGNOSIS — F17200 Nicotine dependence, unspecified, uncomplicated: Secondary | ICD-10-CM | POA: Diagnosis not present

## 2014-03-01 HISTORY — PX: COLONOSCOPY WITH PROPOFOL: SHX5780

## 2014-03-01 HISTORY — DX: Other specified disorders of the skin and subcutaneous tissue: L98.8

## 2014-03-01 SURGERY — COLONOSCOPY WITH PROPOFOL
Anesthesia: Monitor Anesthesia Care

## 2014-03-01 MED ORDER — PROPOFOL 10 MG/ML IV BOLUS
INTRAVENOUS | Status: AC
  Filled 2014-03-01: qty 20

## 2014-03-01 MED ORDER — MIDAZOLAM HCL 2 MG/2ML IJ SOLN
INTRAMUSCULAR | Status: AC
  Filled 2014-03-01: qty 2

## 2014-03-01 MED ORDER — LIDOCAINE HCL (CARDIAC) 20 MG/ML IV SOLN
INTRAVENOUS | Status: DC | PRN
  Administered 2014-03-01: 100 mg via INTRAVENOUS

## 2014-03-01 MED ORDER — PROPOFOL INFUSION 10 MG/ML OPTIME
INTRAVENOUS | Status: DC | PRN
  Administered 2014-03-01: 100 ug/kg/min via INTRAVENOUS

## 2014-03-01 MED ORDER — SODIUM CHLORIDE 0.9 % IV SOLN: INTRAVENOUS | Status: DC

## 2014-03-01 MED ORDER — MIDAZOLAM HCL 5 MG/5ML IJ SOLN
INTRAMUSCULAR | Status: DC | PRN
  Administered 2014-03-01: 2 mg via INTRAVENOUS

## 2014-03-01 MED ORDER — PROPOFOL 10 MG/ML IV BOLUS
INTRAVENOUS | Status: DC | PRN
  Administered 2014-03-01: 50 mg via INTRAVENOUS

## 2014-03-01 MED ORDER — LIDOCAINE HCL (CARDIAC) 20 MG/ML IV SOLN
INTRAVENOUS | Status: AC
  Filled 2014-03-01: qty 5

## 2014-03-01 MED ORDER — LACTATED RINGERS IV SOLN
INTRAVENOUS | Status: DC | PRN
  Administered 2014-03-01: 14:00:00 via INTRAVENOUS

## 2014-03-01 SURGICAL SUPPLY — 22 items

## 2014-03-01 NOTE — Op Note (Signed)
Problem: Recurrent diverticulitis. 01/25/2014 CT scan of the abdomen and pelvis showed sigmoid colon diverticulitis.  Endoscopist: Danise Edge  Premedication: Propofol administered by anesthesia  Procedure: Diagnostic flexible proctosigmoidoscopy to 20 cm. Diagnostic colonoscopy could not be performed due to sigmoid colon loop formation and narrowing of the distal sigmoid colon lumen  The patient was placed in the left lateral decubitus position. Anal inspection and digital rectal exam were normal. The Pentax pediatric colonoscope was introduced into the rectum and advanced to approximately 20 cm from the anal verge. I could not advance the pediatric colonoscope proximally from this point secondary to colonic loop formation and narrowing of the distal sigmoid colon lumen. The pediatric colonoscope was removed. The Pentax gastroscope was introduced into the and advanced to approximately 20 cm from the anal verge. I could not advance the Pentax gastroscope proximal secondary to colonic loop formation and narrowing of the distal sigmoid colon lumen.  Flexible proctosigmoidoscopy to 20 cm reveals left colonic diverticulosis. The rectum appeared normal. Retroflexed view of the distal rectum was normal.  Plan: Schedule barium enema today to look for sigmoid colon stricture.

## 2014-03-01 NOTE — Discharge Instructions (Addendum)

## 2014-03-01 NOTE — H&P (Signed)
  Procedure: Diagnostic colonoscopy following treatment of acute sigmoid diverticulitis  History: The patient is a 38 year old female born Nov 16, 1975. On 01/25/2014, the patient was evaluated in the emergency room complaining of lower abdominal pain. CBC with differential was normal. Complete metabolic profile was normal. Urinalysis was normal. Urine pregnancy test was negative. CT scan of the abdomen and pelvis showed sigmoid colon diverticulosis with superimposed inflammatory changes consistent with diverticulitis. No abscess was visualized. The patient was discharged on oral ciprofloxacin plus metronidazole.  The patient is scheduled to undergo a preoperative colonoscopy following treatment of acute sigmoid colon diverticulitis.  Medication allergies: None  Past medical history: Hypertension  Habits: The patient chronically smokes cigarettes  Chronic medications: Diovan 160 mg daily. Hydrochlorothiazide 25 mg daily.  Exam: The patient is alert and lying comfortably on the endoscopy stretcher. Abdomen is soft and nontender to palpation. Lungs are clear to auscultation. Cardiac exam reveals a regular rhythm.  Plan: Proceed with diagnostic colonoscopy following antibiotic therapy for acute sigmoid colon diverticulitis.

## 2014-03-01 NOTE — Transfer of Care (Signed)
Immediate Anesthesia Transfer of Care Note  Patient: Brandy Vaughn  Procedure(s) Performed: Procedure(s): COLONOSCOPY WITH PROPOFOL (N/A)  Patient Location: PACU and Endoscopy Unit  Anesthesia Type:MAC  Level of Consciousness: awake, alert , oriented and patient cooperative  Airway & Oxygen Therapy: Patient Spontanous Breathing and Patient connected to face mask oxygen  Post-op Assessment: Report given to PACU RN, Post -op Vital signs reviewed and stable and Patient moving all extremities  Post vital signs: Reviewed and stable  Complications: No apparent anesthesia complications

## 2014-03-01 NOTE — Anesthesia Preprocedure Evaluation (Addendum)
Anesthesia Evaluation  Patient identified by MRN, date of birth, ID band Patient awake    Reviewed: Allergy & Precautions, H&P , NPO status , Patient's Chart, lab work & pertinent test results  Airway Mallampati: II TM Distance: >3 FB Neck ROM: Full    Dental no notable dental hx.    Pulmonary former smoker,  breath sounds clear to auscultation  Pulmonary exam normal       Cardiovascular hypertension, Pt. on medications Rhythm:Regular Rate:Normal     Neuro/Psych PSYCHIATRIC DISORDERS negative neurological ROS     GI/Hepatic negative GI ROS, Neg liver ROS,   Endo/Other  negative endocrine ROS  Renal/GU negative Renal ROS  negative genitourinary   Musculoskeletal negative musculoskeletal ROS (+)   Abdominal   Peds negative pediatric ROS (+)  Hematology negative hematology ROS (+)   Anesthesia Other Findings   Reproductive/Obstetrics negative OB ROS                          Anesthesia Physical Anesthesia Plan  ASA: II  Anesthesia Plan: MAC   Post-op Pain Management:    Induction: Intravenous  Airway Management Planned: Simple Face Mask  Additional Equipment:   Intra-op Plan:   Post-operative Plan:   Informed Consent: I have reviewed the patients History and Physical, chart, labs and discussed the procedure including the risks, benefits and alternatives for the proposed anesthesia with the patient or authorized representative who has indicated his/her understanding and acceptance.   Dental advisory given  Plan Discussed with: CRNA  Anesthesia Plan Comments:        Anesthesia Quick Evaluation

## 2014-03-02 ENCOUNTER — Encounter (HOSPITAL_COMMUNITY): Payer: Self-pay | Admitting: Gastroenterology

## 2014-03-02 ENCOUNTER — Other Ambulatory Visit (HOSPITAL_COMMUNITY): Payer: Self-pay | Admitting: Gastroenterology

## 2014-03-02 ENCOUNTER — Ambulatory Visit (HOSPITAL_COMMUNITY): Admit: 2014-03-02 | Payer: Medicaid Other

## 2014-03-02 ENCOUNTER — Ambulatory Visit (HOSPITAL_COMMUNITY)
Admission: RE | Admit: 2014-03-02 | Discharge: 2014-03-02 | Disposition: A | Discharge status: Home / Self Care | Payer: Medicaid Other | Source: Ambulatory Visit | Attending: Gastroenterology | Admitting: Gastroenterology

## 2014-03-02 DIAGNOSIS — K5732 Diverticulitis of large intestine without perforation or abscess without bleeding: Secondary | ICD-10-CM | POA: Insufficient documentation

## 2014-03-02 DIAGNOSIS — K56699 Other intestinal obstruction unspecified as to partial versus complete obstruction: Secondary | ICD-10-CM

## 2014-03-02 DIAGNOSIS — K56609 Unspecified intestinal obstruction, unspecified as to partial versus complete obstruction: Secondary | ICD-10-CM | POA: Diagnosis present

## 2014-03-02 NOTE — Anesthesia Postprocedure Evaluation (Signed)
  Anesthesia Post-op Note  Patient: Brandy Vaughn  Procedure(s) Performed: Procedure(s) (LRB): COLONOSCOPY WITH PROPOFOL (N/A)  Patient Location: PACU  Anesthesia Type: MAC  Level of Consciousness: awake and alert   Airway and Oxygen Therapy: Patient Spontanous Breathing  Post-op Pain: mild  Post-op Assessment: Post-op Vital signs reviewed, Patient's Cardiovascular Status Stable, Respiratory Function Stable, Patent Airway and No signs of Nausea or vomiting  Last Vitals:  Filed Vitals:   03/01/14 1519  BP: 119/76  Pulse: 70  Temp:   Resp: 20    Post-op Vital Signs: stable   Complications: No apparent anesthesia complications

## 2014-03-13 ENCOUNTER — Encounter (INDEPENDENT_AMBULATORY_CARE_PROVIDER_SITE_OTHER): Payer: Medicaid Other | Admitting: General Surgery

## 2014-03-13 ENCOUNTER — Other Ambulatory Visit (INDEPENDENT_AMBULATORY_CARE_PROVIDER_SITE_OTHER): Payer: Self-pay | Admitting: General Surgery

## 2014-03-26 ENCOUNTER — Encounter (INDEPENDENT_AMBULATORY_CARE_PROVIDER_SITE_OTHER): Payer: Medicaid Other | Admitting: General Surgery

## 2014-03-27 ENCOUNTER — Emergency Department (HOSPITAL_COMMUNITY): Payer: Medicaid Other

## 2014-03-27 ENCOUNTER — Inpatient Hospital Stay (HOSPITAL_COMMUNITY)
Admission: EM | Admit: 2014-03-27 | Discharge: 2014-03-28 | DRG: 394 | Disposition: A | Discharge status: Home / Self Care | Payer: Medicaid Other | Attending: Internal Medicine | Admitting: Internal Medicine

## 2014-03-27 ENCOUNTER — Encounter (HOSPITAL_COMMUNITY): Payer: Self-pay | Admitting: Emergency Medicine

## 2014-03-27 DIAGNOSIS — I10 Essential (primary) hypertension: Secondary | ICD-10-CM | POA: Diagnosis present

## 2014-03-27 DIAGNOSIS — Z87891 Personal history of nicotine dependence: Secondary | ICD-10-CM | POA: Diagnosis not present

## 2014-03-27 DIAGNOSIS — R599 Enlarged lymph nodes, unspecified: Secondary | ICD-10-CM | POA: Diagnosis present

## 2014-03-27 DIAGNOSIS — Z975 Presence of (intrauterine) contraceptive device: Secondary | ICD-10-CM

## 2014-03-27 DIAGNOSIS — L719 Rosacea, unspecified: Secondary | ICD-10-CM | POA: Diagnosis present

## 2014-03-27 DIAGNOSIS — F988 Other specified behavioral and emotional disorders with onset usually occurring in childhood and adolescence: Secondary | ICD-10-CM | POA: Diagnosis present

## 2014-03-27 DIAGNOSIS — N824 Other female intestinal-genital tract fistulae: Secondary | ICD-10-CM | POA: Diagnosis present

## 2014-03-27 DIAGNOSIS — G8929 Other chronic pain: Secondary | ICD-10-CM | POA: Diagnosis present

## 2014-03-27 DIAGNOSIS — R590 Localized enlarged lymph nodes: Secondary | ICD-10-CM

## 2014-03-27 DIAGNOSIS — K5792 Diverticulitis of intestine, part unspecified, without perforation or abscess without bleeding: Secondary | ICD-10-CM

## 2014-03-27 DIAGNOSIS — F410 Panic disorder [episodic paroxysmal anxiety] without agoraphobia: Secondary | ICD-10-CM | POA: Diagnosis present

## 2014-03-27 DIAGNOSIS — R1032 Left lower quadrant pain: Secondary | ICD-10-CM

## 2014-03-27 DIAGNOSIS — Z79899 Other long term (current) drug therapy: Secondary | ICD-10-CM | POA: Diagnosis not present

## 2014-03-27 DIAGNOSIS — K5732 Diverticulitis of large intestine without perforation or abscess without bleeding: Secondary | ICD-10-CM | POA: Diagnosis present

## 2014-03-27 DIAGNOSIS — M549 Dorsalgia, unspecified: Secondary | ICD-10-CM | POA: Diagnosis present

## 2014-03-27 DIAGNOSIS — Z8742 Personal history of other diseases of the female genital tract: Secondary | ICD-10-CM

## 2014-03-27 HISTORY — DX: Panic disorder (episodic paroxysmal anxiety): F41.0

## 2014-03-27 LAB — COMPREHENSIVE METABOLIC PANEL
ALK PHOS: 57 U/L (ref 39–117)
ALT: 27 U/L (ref 0–35)
AST: 25 U/L (ref 0–37)
Albumin: 4.5 g/dL (ref 3.5–5.2)
Anion gap: 14 (ref 5–15)
BUN: 9 mg/dL (ref 6–23)
CALCIUM: 9.6 mg/dL (ref 8.4–10.5)
CO2: 24 meq/L (ref 19–32)
Chloride: 101 mEq/L (ref 96–112)
Creatinine, Ser: 0.57 mg/dL (ref 0.50–1.10)
GFR calc Af Amer: >90 mL/min (ref >90)
GFR calc non Af Amer: >90 mL/min (ref >90)
Glucose, Bld: 88 mg/dL (ref 70–99)
POTASSIUM: 3.8 meq/L (ref 3.7–5.3)
SODIUM: 139 meq/L (ref 137–147)
TOTAL PROTEIN: 7.8 g/dL (ref 6.0–8.3)
Total Bilirubin: 0.2 mg/dL — ABNORMAL LOW (ref 0.3–1.2)

## 2014-03-27 LAB — CBC WITH DIFFERENTIAL/PLATELET
BASOS ABS: 0 10*3/uL (ref 0.0–0.1)
Basophils Relative: 0 % (ref 0–1)
EOS ABS: 0.2 10*3/uL (ref 0.0–0.7)
EOS PCT: 2 % (ref 0–5)
HCT: 42.6 % (ref 36.0–46.0)
Hemoglobin: 14.5 g/dL (ref 12.0–15.0)
LYMPHS PCT: 30 % (ref 12–46)
Lymphs Abs: 2.9 10*3/uL (ref 0.7–4.0)
MCH: 33.8 pg (ref 26.0–34.0)
MCHC: 34 g/dL (ref 30.0–36.0)
MCV: 99.3 fL (ref 78.0–100.0)
Monocytes Absolute: 0.5 10*3/uL (ref 0.1–1.0)
Monocytes Relative: 6 % (ref 3–12)
Neutro Abs: 5.9 10*3/uL (ref 1.7–7.7)
Neutrophils Relative %: 62 % (ref 43–77)
PLATELETS: 291 10*3/uL (ref 150–400)
RBC: 4.29 MIL/uL (ref 3.87–5.11)
RDW: 12.9 % (ref 11.5–15.5)
WBC: 9.5 10*3/uL (ref 4.0–10.5)

## 2014-03-27 LAB — URINALYSIS, ROUTINE W REFLEX MICROSCOPIC
Bilirubin Urine: NEGATIVE
GLUCOSE, UA: NEGATIVE mg/dL
Hgb urine dipstick: NEGATIVE
Ketones, ur: NEGATIVE mg/dL
Nitrite: NEGATIVE
PH: 7.5 (ref 5.0–8.0)
Protein, ur: NEGATIVE mg/dL
Specific Gravity, Urine: 1.004 — ABNORMAL LOW (ref 1.005–1.030)
Urobilinogen, UA: 0.2 mg/dL (ref 0.0–1.0)

## 2014-03-27 LAB — LIPASE, BLOOD: LIPASE: 41 U/L (ref 11–59)

## 2014-03-27 LAB — URINE MICROSCOPIC-ADD ON

## 2014-03-27 LAB — POC URINE PREG, ED: Preg Test, Ur: NEGATIVE

## 2014-03-27 MED ORDER — METRONIDAZOLE 500 MG PO TABS
500.0000 mg | ORAL_TABLET | Freq: Three times a day (TID) | ORAL | Status: DC
  Administered 2014-03-28 (×2): 500 mg via ORAL
  Filled 2014-03-27 (×3): qty 1

## 2014-03-27 MED ORDER — METRONIDAZOLE IN NACL 5-0.79 MG/ML-% IV SOLN
500.0000 mg | Freq: Once | INTRAVENOUS | Status: DC
  Administered 2014-03-27: 500 mg via INTRAVENOUS
  Filled 2014-03-27: qty 100

## 2014-03-27 MED ORDER — HYDROMORPHONE HCL 1 MG/ML IJ SOLN
1.0000 mg | Freq: Once | INTRAMUSCULAR | Status: AC
  Administered 2014-03-27: 1 mg via INTRAVENOUS
  Filled 2014-03-27: qty 1

## 2014-03-27 MED ORDER — IOHEXOL 300 MG/ML  SOLN
80.0000 mL | Freq: Once | INTRAMUSCULAR | Status: AC | PRN
  Administered 2014-03-27: 80 mL via INTRAVENOUS

## 2014-03-27 MED ORDER — VALSARTAN 160 MG PO TABS
160.0000 mg | ORAL_TABLET | Freq: Every day | ORAL | Status: DC
  Administered 2014-03-28: 160 mg via ORAL
  Filled 2014-03-27: qty 1

## 2014-03-27 MED ORDER — KETOCONAZOLE 2 % EX CREA
1.0000 "application " | TOPICAL_CREAM | Freq: Once | CUTANEOUS | Status: AC | PRN
  Filled 2014-03-27: qty 15

## 2014-03-27 MED ORDER — PNEUMOCOCCAL VAC POLYVALENT 25 MCG/0.5ML IJ INJ
0.5000 mL | INJECTION | Freq: Once | INTRAMUSCULAR | Status: AC
  Administered 2014-03-27: 0.5 mL via INTRAMUSCULAR
  Filled 2014-03-27: qty 0.5

## 2014-03-27 MED ORDER — CIPROFLOXACIN HCL 500 MG PO TABS
500.0000 mg | ORAL_TABLET | Freq: Two times a day (BID) | ORAL | Status: DC
Start: 2014-03-27 — End: 2014-03-28
  Administered 2014-03-27 – 2014-03-28 (×2): 500 mg via ORAL
  Filled 2014-03-27 (×4): qty 1

## 2014-03-27 MED ORDER — CIPROFLOXACIN IN D5W 400 MG/200ML IV SOLN
400.0000 mg | Freq: Two times a day (BID) | INTRAVENOUS | Status: DC
  Filled 2014-03-27: qty 200

## 2014-03-27 MED ORDER — HYDROCHLOROTHIAZIDE 25 MG PO TABS
25.0000 mg | ORAL_TABLET | Freq: Every day | ORAL | Status: DC
  Administered 2014-03-28: 25 mg via ORAL
  Filled 2014-03-27: qty 1

## 2014-03-27 MED ORDER — VALSARTAN-HYDROCHLOROTHIAZIDE 160-25 MG PO TABS: 1.0000 | ORAL_TABLET | Freq: Every morning | ORAL | Status: DC

## 2014-03-27 MED ORDER — ONDANSETRON HCL 4 MG/2ML IJ SOLN
4.0000 mg | Freq: Once | INTRAMUSCULAR | Status: AC
  Administered 2014-03-27: 4 mg via INTRAVENOUS
  Filled 2014-03-27: qty 2

## 2014-03-27 MED ORDER — METHOCARBAMOL 500 MG PO TABS
750.0000 mg | ORAL_TABLET | Freq: Three times a day (TID) | ORAL | Status: DC | PRN
  Administered 2014-03-27 – 2014-03-28 (×2): 750 mg via ORAL
  Filled 2014-03-27 (×3): qty 2

## 2014-03-27 MED ORDER — CEPHALEXIN 500 MG PO CAPS
500.0000 mg | ORAL_CAPSULE | Freq: Two times a day (BID) | ORAL | Status: DC
  Administered 2014-03-27 – 2014-03-28 (×2): 500 mg via ORAL
  Filled 2014-03-27 (×3): qty 1

## 2014-03-27 MED ORDER — SODIUM CHLORIDE 0.9 % IV BOLUS (SEPSIS)
1000.0000 mL | Freq: Once | INTRAVENOUS | Status: AC
  Administered 2014-03-27: 1000 mL via INTRAVENOUS

## 2014-03-27 MED ORDER — IOHEXOL 300 MG/ML  SOLN: 50.0000 mL | Freq: Once | INTRAMUSCULAR | Status: AC | PRN

## 2014-03-27 MED ORDER — HYDROMORPHONE HCL 1 MG/ML IJ SOLN
1.0000 mg | INTRAMUSCULAR | Status: DC | PRN
  Administered 2014-03-27: 1 mg via INTRAVENOUS
  Filled 2014-03-27: qty 1

## 2014-03-27 MED ORDER — SODIUM CHLORIDE 0.9 % IV SOLN
INTRAVENOUS | Status: DC
  Administered 2014-03-27 – 2014-03-28 (×2): via INTRAVENOUS

## 2014-03-27 MED ORDER — AMITRIPTYLINE HCL 50 MG PO TABS
50.0000 mg | ORAL_TABLET | Freq: Every day | ORAL | Status: DC
  Administered 2014-03-27: 50 mg via ORAL
  Filled 2014-03-27 (×2): qty 1

## 2014-03-27 NOTE — ED Notes (Signed)
Patient has a history of bowel obstruction, strictures, perforated bowel with abscess, and a fistula. Patient states she has been on antiibiotics x 4 months for UTI due to fistula. Patient called Dr. Laural BenesJohnson and was told to come to the ED. Patient states she is scheduled for surgery 04/13/14.

## 2014-03-27 NOTE — H&P (Signed)
[facility] History and Physical  Brandy Vaughn ZOX:[id number] DOB: 1976-01-20 DOA: 03/27/2014  Referring physician: EDP PCP: Birdena Jubilee, MD   Chief Complaint: Abdominal pain   HPI: Brandy Vaughn is a 38 y.o. female who presents to the ED with worsening abdominal pain over the past day.  For the past 4 months the patient has been on constant ABx therapy to try and control chronic diverticulitis.  She had 1 episode of diverticulitis last September, and a second episode of diverticulitis in July.  She has developed a colovaginal fistula.  Due to stricturing GI is unable to perform colonoscopy on the patient when they attempted on 03/01/14.  Due to the patients ongoing problems and essentially failed medical management of chronic colitis: general surgery has made the decision to take the patient to the OR, Robotic assisted laparoscopic partial colectomy is already scheduled for 04/13/14.  CT scan today is essentially unchanged, shows what appears to be chronic diverticulitis of the sigmoid colon, malignancy cannot be ruled out (and wasn't able to be ruled out on colonoscopy as noted above).  Review of Systems: Systems reviewed.  As above, otherwise negative  Past Medical History  Diagnosis Date  . Hypertension   . ADD (attention deficit disorder)   . Rosacea   . Vaginal tear resulting from childbirth     3rd degree  . Chronic back pain     on chronic narcotics  . Diverticulitis   . 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 Panic attacks    Past Surgical History  Procedure Laterality Date  . Wisdom tooth extraction    . Colonoscopy with propofol N/A 03/01/2014    Procedure: COLONOSCOPY WITH PROPOFOL;  Surgeon: Charolett Bumpers, MD;  Location: [facility];  Service: Endoscopy;  Laterality: N/A;   Social History:  reports that she quit smoking about 2 months ago. Her smoking use included Cigarettes. She has a 7.5 pack-year smoking  history. She has never used smokeless tobacco. She reports that she does not drink alcohol or use illicit drugs.  No Known Allergies  Family History  Problem Relation Age of Onset  . Cancer Mother     Colon, Cervical   . Hypertension Father   . Osteoporosis Paternal Grandmother   . Hypertension Paternal Grandmother   . Cancer Paternal Grandfather     Bladder, Throat      Prior to Admission medications   Medication Sig Start Date End Date Taking? Authorizing Provider  amitriptyline (ELAVIL) 25 MG tablet Take 50 mg by mouth at bedtime.    Yes Historical Provider, MD  cephALEXin (KEFLEX) 500 MG capsule Take 500 mg by mouth 2 (two) times daily. 02/26/14  Yes Fayrene Helper, PA-C  ciprofloxacin (CIPRO) 500 MG tablet Take 500 mg by mouth 2 (two) times daily.   Yes Historical Provider, MD  etonogestrel (IMPLANON) 68 MG IMPL implant Inject 1 each into the skin once.   Yes Historical Provider, MD  HYDROcodone-acetaminophen (NORCO) 10-325 MG per tablet Take 2 tablets by mouth every 6 (six) hours.    Yes Historical Provider, MD  ketoconazole (NIZORAL) 2 % cream Apply 1 application topically once as needed for irritation.   Yes Historical Provider, MD  lisdexamfetamine (VYVANSE) 40 MG capsule Take 40 mg by mouth daily as needed (focus).   Yes Historical Provider, MD  methocarbamol (ROBAXIN) 750 MG tablet Take 750 mg by mouth 3 (three) times daily as  needed (pain).    Yes Historical Provider, MD  metroNIDAZOLE (FLAGYL) 500 MG tablet Take 500 mg by mouth 3 (three) times daily.   Yes Historical Provider, MD  traMADol (ULTRAM) 50 MG tablet Take 1 tablet (50 mg total) by mouth every 6 (six) hours as needed. 01/25/14  Yes Rolan BuccoMelanie Belfi, MD  valsartan-hydrochlorothiazide (DIOVAN-HCT) 160-25 MG per tablet Take 1 tablet by mouth every morning.   Yes Historical Provider, MD   Physical Exam: Filed Vitals:   03/27/14 1712  BP: 170/100  Pulse: 104  Temp: 98.9 F (37.2 C)  Resp: 20    BP 170/100  Pulse 104   Temp(Src) 98.9 F (37.2 C) (Oral)  Resp 20  Ht 5' 6.5" (1.689 m)  Wt 65.772 kg (145 lb)  BMI 23.06 kg/m2  SpO2 99%  General Appearance:    Alert, oriented, no distress, appears stated age  Head:    Normocephalic, atraumatic  Eyes:    PERRL, EOMI, sclera non-icteric        Nose:   Nares without drainage or epistaxis. Mucosa, turbinates normal  Throat:   Moist mucous membranes. Oropharynx without erythema or exudate.  Neck:   Supple. No carotid bruits.  No thyromegaly.  No lymphadenopathy.   Back:     No CVA tenderness, no spinal tenderness  Lungs:     Clear to auscultation bilaterally, without wheezes, rhonchi or rales  Chest wall:    No tenderness to palpitation  Heart:    Regular rate and rhythm without murmurs, gallops, rubs  Abdomen:     Soft, non-tender, nondistended, normal bowel sounds, no organomegaly  Genitalia:    deferred  Rectal:    deferred  Extremities:   No clubbing, cyanosis or edema.  Pulses:   2+ and symmetric all extremities  Skin:   Skin color, texture, turgor normal, no rashes or lesions  Lymph nodes:   Cervical, supraclavicular, and axillary nodes normal  Neurologic:   CNII-XII intact. Normal strength, sensation and reflexes      throughout    Labs on Admission:  Basic Metabolic Panel:  Recent Labs Lab 03/27/14 1757  NA 139  K 3.8  CL 101  CO2 24  GLUCOSE 88  BUN 9  CREATININE 0.57  CALCIUM 9.6   Liver Function Tests:  Recent Labs Lab 03/27/14 1757  AST 25  ALT 27  ALKPHOS 57  BILITOT 0.2*  PROT 7.8  ALBUMIN 4.5    Recent Labs Lab 03/27/14 1827  LIPASE 41   No results found for this basename: AMMONIA,  in the last 168 hours CBC:  Recent Labs Lab 03/27/14 1757  WBC 9.5  NEUTROABS 5.9  HGB 14.5  HCT 42.6  MCV 99.3  PLT 291   Cardiac Enzymes: No results found for this basename: CKTOTAL, CKMB, CKMBINDEX, TROPONINI,  in the last 168 hours  BNP (last 3 results) No results found for this basename: PROBNP,  in the last 8760  hours CBG: No results found for this basename: GLUCAP,  in the last 168 hours  Radiological Exams on Admission: Ct Abdomen Pelvis W Contrast  03/27/2014   CLINICAL DATA:  Diffuse lower abdominal pain. 38 year old female with history of diverticulitis and sigmoid colon stricture. History of colovaginal fistula  EXAM: CT ABDOMEN AND PELVIS WITH CONTRAST  TECHNIQUE: Multidetector CT imaging of the abdomen and pelvis was performed using the standard protocol following bolus administration of intravenous contrast.  CONTRAST:  80mL OMNIPAQUE IOHEXOL 300 MG/ML  SOLN  COMPARISON:  Barium  enema 03/02/2014 and CT abdomen pelvis with contrast 02/26/2014, CT abdomen pelvis 01/25/2014 and CT abdomen pelvis 01/10/2014  FINDINGS: Normal heart size.  Lung bases are clear.  The liver, gallbladder, spleen, adrenal glands, pancreas, and kidneys are within normal limits.  Normal appearance of the stomach. Small bowel loops are normal in caliber and wall thickness.  Portocaval abdominal lymph node measures 10 x 18 mm, similar to prior CT studies.  The appendix courses medially from the cecum and is normal. There is a moderate amount of stool in the colon. Diverticulosis of the colon is noted, most prominent in the sigmoid region. Oral contrast administered today has not progressed to the colon at the time of imaging. Numerous sigmoid colonic diverticula contain high contrast material, residual from prior contrast administration exam(s).  There is marked wall thickening associated with the mid sigmoid colon; this colonic wall thickening appears very similar to the CT dated 02/26/2014. There is some stranding in the sigmoid mesentery adjacent to the thick-walled portion of the sigmoid colon. This is similar to most recent prior CT exam of 02/26/2014.  There are innumerable round lymph nodes in the sigmoid mesenteries, in the region of the chronically inflamed sigmoid colon; these nodes are best seen on image number 72. These lymph  nodes measure in the 3-5 mm range.  There is a tiny locule of gas adjacent to the left aspect of the vaginal vault on image number 81. Cephalad to this is a chronic appearing soft tissue density/linear tract which extends toward the sigmoid colon. These findings could reflect the patient's reported colovaginal fistula and appear similar to the abdomen see pelvis CT of 01/10/2014.  Currently, no pericolonic abscess is seen. No significant ascites. No evidence of bowel obstruction.  Normal caliber abdominal aorta. The uterus and adnexa are within normal limits. Please note that the intrauterine device described on the CT dated 02/26/2014 is no present.  No acute or suspicious bony abnormality.  IMPRESSION: 1. CT appearances of the mid sigmoid colon characterized by diffuse wall thickening and multiple diverticula. There is persistent versus recurrent mild stranding in the sigmoid mesial colon. Findings most likely reflect chronic sigmoid colon diverticulitis. Given the diffuse wall thickening, underlying neoplasm cannot be excluded. Currently, no evidence of acute abscess or bowel obstruction. 2. Stable appearance of multiple sigmoid mesocolon lymph nodes. These measure in the 3-5 mm range, and are most likely due to chronic inflammation. 3. Similar appearance of a small locule of gas near the left aspect of the vaginal vault with linear fluid soft tissue density tracking cephalad from this. This could reflect the patient's reported colovaginal fistula. Appearances are unchanged. 4. Interval removal of intrauterine device.   Electronically Signed   By: Britta Mccreedy M.D.   On: 03/27/2014 19:51    EKG: Independently reviewed.  Assessment/Plan Principal Problem:   Abdominal pain, left lower quadrant Active Problems:   Colovaginal fistula   Lymphadenopathy, mesenteric   Diverticulitis of sigmoid colon   1. Chronic diverticulitis of the sigmoid colon, colovaginal fistula 1. Continue PO antibiotics for  now 2. Pain control with IV dilaudid PRN 3. NPO 4. IVF 5. At this point patient has failed medical management, diverticulitis is chronic and non-resolving.  Malignancy also cannot be ruled out as colonoscopy could not be performed by GI due to stricturing.  Only question at this point really is the timing of surgery on this patient (this admit vs waiting for her already scheduled OR time on 04/13/14) 6. Surgery consulted and  coming to eval patient in AM.   Code Status: Full Code  Family Communication: No family in room Disposition Plan: Admit to inpatient   Time spent: 70 min  Maitland Muhlbauer M. [facility] Pager [phone]  If 7AM-7PM, please contact the day team taking care of the patient [email or web address] Password [facility] 03/27/2014, 9:47 PM

## 2014-03-27 NOTE — ED Provider Notes (Signed)
TIME SEEN: 6:28 PM  CHIEF COMPLAINT: Abdominal pain, subjective fever, chills, nausea, vomiting  HPI: Patient is a 38 year old female with history of hypertension who presents to the emergency department complaints of diffuse abdominal pain that has been worse for the past 3 days. She reports that she started having recurrent episodes of diverticulitis in June. She has been on oral Cipro and Flagyl for several weeks. She was also found to have a colovaginal fistula. She is followed by Dr. Danise EdgeMartin Johnson with gastroenterology. She is scheduled to have a colon resection with Dr. Maisie Fushomas October 16. She reports that she is on hydrocodone daily and states it has not been helping her pain for the past 3 days. She reports she's had subjective fevers and chills. She is also had nausea and vomiting. She is somewhat more constipated but no bloody stool or melena. No diarrhea. No sick contacts or recent travel. No prior history of abdominal surgery. She has never been diagnosed with inflammatory bowel disease but states that she has been unable to have a colonoscopy do to "strictures".  ROS: See HPI Constitutional: Subjective fever  Eyes: no drainage  ENT: no runny nose   Cardiovascular:  no chest pain  Resp: no SOB  GI:  vomiting GU: no dysuria Integumentary: no rash  Allergy: no hives  Musculoskeletal: no leg swelling  Neurological: no slurred speech ROS otherwise negative  PAST MEDICAL HISTORY/PAST SURGICAL HISTORY:  Past Medical History  Diagnosis Date  . Hypertension   . ADD (attention deficit disorder)   . Rosacea   . Vaginal tear resulting from childbirth     3rd degree  . Chronic back pain     on chronic narcotics  . Diverticulitis   . 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 Panic attacks     MEDICATIONS:  Prior to Admission medications   Medication Sig Start Date End Date Taking? Authorizing Provider  amitriptyline (ELAVIL) 25 MG  tablet Take 50 mg by mouth at bedtime.    Yes Historical Provider, MD  cephALEXin (KEFLEX) 500 MG capsule Take 500 mg by mouth 2 (two) times daily. 02/26/14  Yes Fayrene HelperBowie Tran, PA-C  ciprofloxacin (CIPRO) 500 MG tablet Take 500 mg by mouth 2 (two) times daily.   Yes Historical Provider, MD  etonogestrel (IMPLANON) 68 MG IMPL implant Inject 1 each into the skin once.   Yes Historical Provider, MD  HYDROcodone-acetaminophen (NORCO) 10-325 MG per tablet Take 2 tablets by mouth every 6 (six) hours.    Yes Historical Provider, MD  ketoconazole (NIZORAL) 2 % cream Apply 1 application topically once as needed for irritation.   Yes Historical Provider, MD  lisdexamfetamine (VYVANSE) 40 MG capsule Take 40 mg by mouth daily as needed (focus).   Yes Historical Provider, MD  methocarbamol (ROBAXIN) 750 MG tablet Take 750 mg by mouth 3 (three) times daily as needed (pain).    Yes Historical Provider, MD  metroNIDAZOLE (FLAGYL) 500 MG tablet Take 500 mg by mouth 3 (three) times daily.   Yes Historical Provider, MD  traMADol (ULTRAM) 50 MG tablet Take 1 tablet (50 mg total) by mouth every 6 (six) hours as needed. 01/25/14  Yes Rolan BuccoMelanie Belfi, MD  valsartan-hydrochlorothiazide (DIOVAN-HCT) 160-25 MG per tablet Take 1 tablet by mouth every morning.   Yes Historical Provider, MD    ALLERGIES:  No Known Allergies  SOCIAL HISTORY:  History  Substance Use Topics  . Smoking  status: Former Smoker -- 0.50 packs/day for 15 years    Types: Cigarettes    Quit date: 12/27/2013  . Smokeless tobacco: Never Used     Comment: She is trying E-Cigarrettes to quit smoking   . Alcohol Use: No    FAMILY HISTORY: Family History  Problem Relation Age of Onset  . Cancer Mother     Colon, Cervical   . Hypertension Father   . Osteoporosis Paternal Grandmother   . Hypertension Paternal Grandmother   . Cancer Paternal Grandfather     Bladder, Throat     EXAM: BP 170/100  Pulse 104  Temp(Src) 98.9 F (37.2 C) (Oral)  Resp  20  Ht 5' 6.5" (1.689 m)  Wt 145 lb (65.772 kg)  BMI 23.06 kg/m2  SpO2 99% CONSTITUTIONAL: Alert and oriented and responds appropriately to questions. Well-appearing; well-nourished HEAD: Normocephalic EYES: Conjunctivae clear, PERRL ENT: normal nose; no rhinorrhea; moist mucous membranes; pharynx without lesions noted NECK: Supple, no meningismus, no LAD  CARD: RRR; S1 and S2 appreciated; no murmurs, no clicks, no rubs, no gallops RESP: Normal chest excursion without splinting or tachypnea; breath sounds clear and equal bilaterally; no wheezes, no rhonchi, no rales,  ABD/GI: Normal bowel sounds; non-distended; soft, diffusely tender to palpation without guarding or rebound, no peritoneal signs BACK:  The back appears normal and is non-tender to palpation, there is no CVA tenderness EXT: Normal ROM in all joints; non-tender to palpation; no edema; normal capillary refill; no cyanosis    SKIN: Normal color for age and race; warm NEURO: Moves all extremities equally PSYCH: The patient's mood and manner are appropriate. Grooming and personal hygiene are appropriate.  MEDICAL DECISION MAKING: Patient here with an exacerbation of pain from her chronic abdominal pain from her recurrent sigmoid diverticulitis. She also hasn't been fistula. Concern for perforation or abscess given her worsening pain. We'll obtain labs, urine and a CT of her abdomen and pelvis. We'll give IV fluids pain and nausea medicine.  ED PROGRESS: Patient's labs are unremarkable. No leukocytosis. Pregnancy test is negative. CT scan shows diffuse wall thickening and recurrent mild stranding of the sigmoid colon reflecting chronic sigmoid diverticulitis without abscess or bowel obstruction. There is also similar appearance of ocular or glass near the left aspect of the vaginal vault with linear fluid soft tissue density tracking that is the patient's colovaginal fistula. Discussed with Dr. Matthias Hughs with gastroenterology. He  recommends continued IV fluids, IV antibiotics and pain control. They will see the patient in the morning. He agrees with medicine admission. Discuss with Dr. Julian Reil with hospitalist service who agrees to admit patient. Also discussed with Dr. Daphine Deutscher with general surgery. Surgery to also see patient in the morning. Patient up-to-date with plan. She is hemodynamically stable, comfortable after several rounds of IV narcotic medications.     Layla Maw Ward, DO 03/27/14 2346

## 2014-03-28 LAB — BASIC METABOLIC PANEL
Anion gap: 8 (ref 5–15)
BUN: 8 mg/dL (ref 6–23)
CALCIUM: 8 mg/dL — AB (ref 8.4–10.5)
CO2: 25 mEq/L (ref 19–32)
Chloride: 108 mEq/L (ref 96–112)
Creatinine, Ser: 0.62 mg/dL (ref 0.50–1.10)
GFR calc Af Amer: >90 mL/min (ref >90)
GFR calc non Af Amer: >90 mL/min (ref >90)
GLUCOSE: 105 mg/dL — AB (ref 70–99)
POTASSIUM: 4 meq/L (ref 3.7–5.3)
Sodium: 141 mEq/L (ref 137–147)

## 2014-03-28 LAB — CBC
HEMATOCRIT: 35.2 % — AB (ref 36.0–46.0)
HEMOGLOBIN: 11.7 g/dL — AB (ref 12.0–15.0)
MCH: 33 pg (ref 26.0–34.0)
MCHC: 33.2 g/dL (ref 30.0–36.0)
MCV: 99.2 fL (ref 78.0–100.0)
Platelets: 253 10*3/uL (ref 150–400)
RBC: 3.55 MIL/uL — AB (ref 3.87–5.11)
RDW: 12.9 % (ref 11.5–15.5)
WBC: 7.5 10*3/uL (ref 4.0–10.5)

## 2014-03-28 MED ORDER — HYDROCODONE-ACETAMINOPHEN 10-325 MG PO TABS
2.0000 | ORAL_TABLET | Freq: Four times a day (QID) | ORAL | Status: DC | PRN
  Administered 2014-03-28: 2 via ORAL
  Filled 2014-03-28 (×2): qty 2

## 2014-03-28 MED ORDER — HYDROMORPHONE HCL 1 MG/ML IJ SOLN
1.0000 mg | INTRAMUSCULAR | Status: AC | PRN
  Administered 2014-03-28 (×3): 1 mg via INTRAVENOUS
  Filled 2014-03-28 (×3): qty 1

## 2014-03-28 MED ORDER — HEPARIN SODIUM (PORCINE) 5000 UNIT/ML IJ SOLN
5000.0000 [IU] | Freq: Three times a day (TID) | INTRAMUSCULAR | Status: DC
  Administered 2014-03-28: 5000 [IU] via SUBCUTANEOUS
  Filled 2014-03-28 (×3): qty 1

## 2014-03-28 MED ORDER — NICOTINE 21 MG/24HR TD PT24
21.0000 mg | MEDICATED_PATCH | Freq: Every day | TRANSDERMAL | Status: DC
  Administered 2014-03-28: 21 mg via TRANSDERMAL
  Filled 2014-03-28: qty 1

## 2014-03-28 MED ORDER — BOOST / RESOURCE BREEZE PO LIQD
1.0000 | Freq: Two times a day (BID) | ORAL | Status: DC
  Administered 2014-03-28: 1 via ORAL

## 2014-03-28 MED ORDER — MORPHINE SULFATE 2 MG/ML IJ SOLN
2.0000 mg | INTRAMUSCULAR | Status: DC | PRN
  Administered 2014-03-28: 2 mg via INTRAVENOUS
  Filled 2014-03-28: qty 1

## 2014-03-28 MED ORDER — HYDROMORPHONE HCL 1 MG/ML IJ SOLN
1.0000 mg | Freq: Once | INTRAMUSCULAR | Status: AC
  Administered 2014-03-28: 1 mg via INTRAVENOUS
  Filled 2014-03-28: qty 1

## 2014-03-28 NOTE — Progress Notes (Signed)
Problem: Chronic sigmoid colon diverticulitis complicated by pain and sigmoid colon narrowing. On 93/2015, the patient underwent a diagnostic flexible proctosigmoidoscopy performed to 20 cm which showed sigmoid colon diverticulosis with colonic lumen narrowing. A colonoscopy could not be performed. On 03/02/2014 single column barium enema showed 10 length of sigmoid colon narrowing and wall thickening. Recommend surgical consultation to perform sigmoid colon resection.

## 2014-03-28 NOTE — Progress Notes (Signed)
Patient ID: Brandy Vaughn, female   DOB: 04/12/1976, 38 y.o.   MRN: [id number]  Brandy Vaughn is well known to our service with a history of sigmoid diverticulitis and small colovaginal fistula. She is on schedule to have a robotic partial colectomy by Dr. Romie LeveeAlicia Thomas on October 16.  She was admitted yesterday with increased left lower quadrant abdominal pain. She reports she has had some nausea but no vomiting and she is having bowel movements still. She has had no vaginal drainage over the past several days. She is on pain medication chronically.  Today, she is having less discomfort. On exam, she is well in appearance. Her abdomen is soft with only mild left lower quadrant tenderness and guarding  Again, her CAT scan shows sigmoid diverticulitis without evidence of perforation.  Assessment and plan:  Hopefully, she will continue to improve with conservative management and surgery can proceed as planned on October 16. If she acutely worsens, she may need exploration and a colostomy. Dr. Maisie Fushomas it is our acute care surgeon next week. She may be able to move the surgery up next week but it may not be able to be accomplished with robotics depending on the schedule. We will follow her closely.

## 2014-03-28 NOTE — Progress Notes (Signed)
Patient Demographics  Brandy Vaughn, is a 38 y.o. female, DOB - 06/15/1976, ZOX:[id number]  Admit date - 03/27/2014   Admitting Physician Hillary BowJared M Gardner, DO  Outpatient Primary MD for the patient is [name], ROBYN, MD  LOS - 1   Chief Complaint  Patient presents with  . Abdominal Pain      Brief narrative: Patient is known to have history of chronic diverticulitis, colovesicular fistula, [facility], presents with worsening abdominal pain, CT abdomen does not show acute findings, continued on her oral by mouth Cipro and Flagyl, admitted for further evaluation by surgery.  Subjective:   [name] today has, No headache, No chest pain, No new weakness tingling or numbness, No Cough - SOB, reports abdominal pain is improving, no vomiting, but mild nausea.  Assessment & Plan    Principal Problem:   Abdominal pain, left lower quadrant Active Problems:   Colovaginal fistula   Lymphadenopathy, mesenteric   Diverticulitis of sigmoid colon  Abdominal pain/chronic diverticulitis/colovaginal fistula: -Continue with by mouth Cipro and Flagyl, surgical input appreciated, advanced to clear liquid diet, plan as per surgical team. -Continue with when necessary pain and nausea medication.  Hypertension: - Blood pressure is acceptable, continue with hydrochlorothiazide and valsartan.  Code Status: Full  Family Communication: Patient is awake and alert  Disposition Plan: Depends on further surgical evaluation.   Procedures  none   Consults    Surgery GI    Medications  Scheduled Meds: . amitriptyline  50 mg Oral QHS  . cephALEXin  500 mg Oral BID  . ciprofloxacin  500 mg Oral BID  . feeding supplement (RESOURCE BREEZE)  1 Container Oral BID BM  . valsartan  160 mg Oral Daily   And  .  hydrochlorothiazide  25 mg Oral Daily  . metroNIDAZOLE  500 mg Oral TID  . nicotine  21 mg Transdermal Daily   Continuous Infusions: . sodium chloride 125 mL/hr at 03/28/14 1110   PRN Meds:.HYDROcodone-acetaminophen, methocarbamol  DVT Prophylaxis   Heparin - SCDs  Lab Results  Component Value Date   PLT 253 03/28/2014    Antibiotics    Anti-infectives   Start     Dose/Rate Route Frequency Ordered Stop   03/27/14 2200  cephALEXin (KEFLEX) capsule 500 mg     500 mg Oral 2 times daily 03/27/14 2137     03/27/14 2200  metroNIDAZOLE (FLAGYL) tablet 500 mg     500 mg Oral 3 times daily 03/27/14 2137     03/27/14 2145  ciprofloxacin (CIPRO) tablet 500 mg     500 mg Oral 2 times daily 03/27/14 2137     03/27/14 2130  ciprofloxacin (CIPRO) IVPB 400 mg  Status:  Discontinued     400 mg 200 mL/hr over 60 Minutes Intravenous Every 12 hours 03/27/14 2117 03/27/14 2232   03/27/14 2130  metroNIDAZOLE (FLAGYL) IVPB 500 mg  Status:  Discontinued     500 mg 100 mL/hr over 60 Minutes Intravenous  Once 03/27/14 2117 03/27/14 2233          Objective:   Filed Vitals:   03/27/14 2207 03/27/14 2218 03/27/14 2237 03/28/14 0700  BP: 125/78 129/78 134/79 118/61  Pulse:  88 87 98 71  Temp: 98.9 F (37.2 C) 98.4 F (36.9 C) 98.5 F (36.9 C) 98 F (36.7 C)  TempSrc: Oral Oral Oral Oral  Resp:  18 18 18   Height:      Weight:      SpO2: 100% 100% 100% 98%    Wt Readings from Last 3 Encounters:  03/27/14 65.772 kg (145 lb)  03/01/14 66.225 kg (146 lb)  03/01/14 66.225 kg (146 lb)     Intake/Output Summary (Last 24 hours) at 03/28/14 1246 Last data filed at 03/28/14 0752  Gross per 24 hour  Intake      0 ml  Output      0 ml  Net      0 ml     Physical Exam  Awake Alert, Oriented X 3, No new F.N deficits, Normal affect [place].AT,PERRAL Supple Neck,No JVD, No cervical lymphadenopathy appriciated.  Symmetrical Chest wall movement, Good air movement bilaterally, CTAB RRR,No  Gallops,Rubs or new Murmurs, No Parasternal Heave +ve B.Sounds, Abd Soft, No organomegaly appriciated, No rebound - guarding or rigidity, mild tenderness in left lower quadrant. No Cyanosis, Clubbing or edema, No new Rash or bruise     Data Review   Micro Results No results found for this or any previous visit (from the past 240 hour(s)).  Radiology Reports Ct Abdomen Pelvis W Contrast  03/27/2014   CLINICAL DATA:  Diffuse lower abdominal pain. 38 year old female with history of diverticulitis and sigmoid colon stricture. History of colovaginal fistula  EXAM: CT ABDOMEN AND PELVIS WITH CONTRAST  TECHNIQUE: Multidetector CT imaging of the abdomen and pelvis was performed using the standard protocol following bolus administration of intravenous contrast.  CONTRAST:  80mL OMNIPAQUE IOHEXOL 300 MG/ML  SOLN  COMPARISON:  Barium enema 03/02/2014 and CT abdomen pelvis with contrast 02/26/2014, CT abdomen pelvis 01/25/2014 and CT abdomen pelvis 01/10/2014  FINDINGS: Normal heart size.  Lung bases are clear.  The liver, gallbladder, spleen, adrenal glands, pancreas, and kidneys are within normal limits.  Normal appearance of the stomach. Small bowel loops are normal in caliber and wall thickness.  Portocaval abdominal lymph node measures 10 x 18 mm, similar to prior CT studies.  The appendix courses medially from the cecum and is normal. There is a moderate amount of stool in the colon. Diverticulosis of the colon is noted, most prominent in the sigmoid region. Oral contrast administered today has not progressed to the colon at the time of imaging. Numerous sigmoid colonic diverticula contain high contrast material, residual from prior contrast administration exam(s).  There is marked wall thickening associated with the mid sigmoid colon; this colonic wall thickening appears very similar to the CT dated 02/26/2014. There is some stranding in the sigmoid mesentery adjacent to the thick-walled portion of the  sigmoid colon. This is similar to most recent prior CT exam of 02/26/2014.  There are innumerable round lymph nodes in the sigmoid mesenteries, in the region of the chronically inflamed sigmoid colon; these nodes are best seen on image number 72. These lymph nodes measure in the 3-5 mm range.  There is a tiny locule of gas adjacent to the left aspect of the vaginal vault on image number 81. Cephalad to this is a chronic appearing soft tissue density/linear tract which extends toward the sigmoid colon. These findings could reflect the patient's reported colovaginal fistula and appear similar to the abdomen see pelvis CT of 01/10/2014.  Currently, no pericolonic abscess is seen. No significant ascites. No evidence  of bowel obstruction.  Normal caliber abdominal aorta. The uterus and adnexa are within normal limits. Please note that the intrauterine device described on the CT dated 02/26/2014 is no present.  No acute or suspicious bony abnormality.  IMPRESSION: 1. CT appearances of the mid sigmoid colon characterized by diffuse wall thickening and multiple diverticula. There is persistent versus recurrent mild stranding in the sigmoid mesial colon. Findings most likely reflect chronic sigmoid colon diverticulitis. Given the diffuse wall thickening, underlying neoplasm cannot be excluded. Currently, no evidence of acute abscess or bowel obstruction. 2. Stable appearance of multiple sigmoid mesocolon lymph nodes. These measure in the 3-5 mm range, and are most likely due to chronic inflammation. 3. Similar appearance of a small locule of gas near the left aspect of the vaginal vault with linear fluid soft tissue density tracking cephalad from this. This could reflect the patient's reported colovaginal fistula. Appearances are unchanged. 4. Interval removal of intrauterine device.   Electronically Signed   By: Britta Mccreedy M.D.   On: 03/27/2014 19:51    CBC  Recent Labs Lab 03/27/14 1757 03/28/14 0530  WBC 9.5  7.5  HGB 14.5 11.7*  HCT 42.6 35.2*  PLT 291 253  MCV 99.3 99.2  MCH 33.8 33.0  MCHC 34.0 33.2  RDW 12.9 12.9  LYMPHSABS 2.9  --   MONOABS 0.5  --   EOSABS 0.2  --   BASOSABS 0.0  --     Chemistries   Recent Labs Lab 03/27/14 1757 03/28/14 0530  NA 139 141  K 3.8 4.0  CL 101 108  CO2 24 25  GLUCOSE 88 105*  BUN 9 8  CREATININE 0.57 0.62  CALCIUM 9.6 8.0*  AST 25  --   ALT 27  --   ALKPHOS 57  --   BILITOT 0.2*  --    ------------------------------------------------------------------------------------------------------------------ estimated creatinine clearance is 89.3 ml/min (by C-G formula based on Cr of 0.62). ------------------------------------------------------------------------------------------------------------------ No results found for this basename: HGBA1C,  in the last 72 hours ------------------------------------------------------------------------------------------------------------------ No results found for this basename: CHOL, HDL, LDLCALC, TRIG, CHOLHDL, LDLDIRECT,  in the last 72 hours ------------------------------------------------------------------------------------------------------------------ No results found for this basename: TSH, T4TOTAL, FREET3, T3FREE, THYROIDAB,  in the last 72 hours ------------------------------------------------------------------------------------------------------------------ No results found for this basename: VITAMINB12, FOLATE, FERRITIN, TIBC, IRON, RETICCTPCT,  in the last 72 hours  Coagulation profile No results found for this basename: INR, PROTIME,  in the last 168 hours  No results found for this basename: DDIMER,  in the last 72 hours  Cardiac Enzymes No results found for this basename: CK, CKMB, TROPONINI, MYOGLOBIN,  in the last 168 hours ------------------------------------------------------------------------------------------------------------------ No components found with this basename: POCBNP,       Time Spent in minutes   25 minutes.   Randol Kern, DAWOOD M.D on 03/28/2014 at 12:46 PM  Between 7am to 7pm - Pager - [phone]  After 7pm go to [email or web address] - password [email or web address]  And look for the night coverage person covering for me after hours  [facility] Office  [phone]   **Disclaimer: This note may have been dictated with voice recognition software. Similar sounding words can inadvertently be transcribed and this note may contain transcription errors which may not have been corrected upon publication of note.**

## 2014-03-28 NOTE — Discharge Summary (Signed)
Brandy Vaughn, 38 y.o., DOB [birth year], MRN [id number]. Admission date: 03/27/2014 Discharge Date 03/28/2014 Primary MD Birdena Jubilee, MD Admitting Physician Hillary Bow, DO  Admission Diagnosis  Abdominal pain, left lower quadrant [789.04] Sigmoid diverticulitis [562.11] Colovaginal fistula [619.1] Lymphadenopathy, mesenteric [785.6]  Discharge Diagnosis   Principal Problem:   Abdominal pain, left lower quadrant Active Problems:   Colovaginal fistula   Lymphadenopathy, mesenteric   Diverticulitis of sigmoid colon      Past Medical History  Diagnosis Date  . Hypertension   . ADD (attention deficit disorder)   . Rosacea   . Vaginal tear resulting from childbirth     3rd degree  . Chronic back pain     on chronic narcotics  . Diverticulitis   . 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 Panic attacks     Past Surgical History  Procedure Laterality Date  . Wisdom tooth extraction    . Colonoscopy with propofol N/A 03/01/2014    Procedure: COLONOSCOPY WITH PROPOFOL;  Surgeon: Charolett Bumpers, MD;  Location: [facility];  Service: Endoscopy;  Laterality: N/A;   Brief narrative:  Patient is known to have history of chronic diverticulitis, colovesicular fistula, planned to have robotic partial colectomy by [facility] surgery Dr. Romie Levee on 10/16/215,  presents with worsening abdominal pain, CT abdomen does not show acute findings, patient was already started on by mouth Flagyl, Cipro, colchicine as an outpatient, admitted for abdominal pain, nausea, vomiting, patient remained a febrile, did not have any leukocytosis, she tolerated a clear liquid diet which was advanced, with no further vomiting while in the hospital, her pain was controlled as well, surgery were consulted, and given the fact she did not have any acute findings on the CAT scan, a febrile, and surgical team discussed it with Dr. Romie Levee to see if she wants  to proceed with surgery during his hospitalization, but given no acute finding in her CT abdomen and pelvis , and patient stable condition, decision was made to continue with original appointment for robotic surgery on 10/16.  Hospital Course See H&P, Labs, Consult and Test reports for all details in brief .  Principal Problem:   Abdominal pain, left lower quadrant Active Problems:   Colovaginal fistula   Lymphadenopathy, mesenteric   Diverticulitis of sigmoid colon    Consults    Significant Tests:  See full reports for all details    Ct Abdomen Pelvis W Contrast  03/27/2014   CLINICAL DATA:  Diffuse lower abdominal pain. 38 year old female with history of diverticulitis and sigmoid colon stricture. History of colovaginal fistula  EXAM: CT ABDOMEN AND PELVIS WITH CONTRAST  TECHNIQUE: Multidetector CT imaging of the abdomen and pelvis was performed using the standard protocol following bolus administration of intravenous contrast.  CONTRAST:  80mL OMNIPAQUE IOHEXOL 300 MG/ML  SOLN  COMPARISON:  Barium enema 03/02/2014 and CT abdomen pelvis with contrast 02/26/2014, CT abdomen pelvis 01/25/2014 and CT abdomen pelvis 01/10/2014  FINDINGS: Normal heart size.  Lung bases are clear.  The liver, gallbladder, spleen, adrenal glands, pancreas, and kidneys are within normal limits.  Normal appearance of the stomach. Small bowel loops are normal in caliber and wall thickness.  Portocaval abdominal lymph node measures 10 x 18 mm, similar to prior CT studies.  The appendix courses medially from the cecum and is normal. There is a moderate amount of stool in the colon. Diverticulosis of the  colon is noted, most prominent in the sigmoid region. Oral contrast administered today has not progressed to the colon at the time of imaging. Numerous sigmoid colonic diverticula contain high contrast material, residual from prior contrast administration exam(s).  There is marked wall thickening associated with the mid  sigmoid colon; this colonic wall thickening appears very similar to the CT dated 02/26/2014. There is some stranding in the sigmoid mesentery adjacent to the thick-walled portion of the sigmoid colon. This is similar to most recent prior CT exam of 02/26/2014.  There are innumerable round lymph nodes in the sigmoid mesenteries, in the region of the chronically inflamed sigmoid colon; these nodes are best seen on image number 72. These lymph nodes measure in the 3-5 mm range.  There is a tiny locule of gas adjacent to the left aspect of the vaginal vault on image number 81. Cephalad to this is a chronic appearing soft tissue density/linear tract which extends toward the sigmoid colon. These findings could reflect the patient's reported colovaginal fistula and appear similar to the abdomen see pelvis CT of 01/10/2014.  Currently, no pericolonic abscess is seen. No significant ascites. No evidence of bowel obstruction.  Normal caliber abdominal aorta. The uterus and adnexa are within normal limits. Please note that the intrauterine device described on the CT dated 02/26/2014 is no present.  No acute or suspicious bony abnormality.  IMPRESSION: 1. CT appearances of the mid sigmoid colon characterized by diffuse wall thickening and multiple diverticula. There is persistent versus recurrent mild stranding in the sigmoid mesial colon. Findings most likely reflect chronic sigmoid colon diverticulitis. Given the diffuse wall thickening, underlying neoplasm cannot be excluded. Currently, no evidence of acute abscess or bowel obstruction. 2. Stable appearance of multiple sigmoid mesocolon lymph nodes. These measure in the 3-5 mm range, and are most likely due to chronic inflammation. 3. Similar appearance of a small locule of gas near the left aspect of the vaginal vault with linear fluid soft tissue density tracking cephalad from this. This could reflect the patient's reported colovaginal fistula. Appearances are unchanged.  4. Interval removal of intrauterine device.   Electronically Signed   By: Britta Mccreedy M.D.   On: 03/27/2014 19:51   Ct Abdomen Pelvis W Contrast  02/26/2014   CLINICAL DATA:  ABDOMINAL PAIN ANXIETY multiple abd surgery, hx of colvaginal fistula, prior diverticulitis  EXAM: CT ABDOMEN AND PELVIS WITH CONTRAST  TECHNIQUE: Multidetector CT imaging of the abdomen and pelvis was performed using the standard protocol following bolus administration of intravenous contrast.  CONTRAST:  OMNIPAQUE IOHEXOL 300 MG/ML  SOLN  COMPARISON:  01/25/2014 and earlier studies  FINDINGS: Visualized lung bases clear. Unremarkable liver, gallbladder, spleen, adrenal glands, kidneys, pancreas, aorta, portal vein. Retroaortic left renal vein, an anatomic variant. Stomach, small bowel, and colon are nondilated. Normal appendix. Scattered sigmoid diverticula. There is persistent wall thickening in the mid and distal sigmoid colon with mild inflammatory/ edematous changes in the adjacent pericolic fat just above the rectosigmoid junction. IUD is in place. Uterus and adnexal regions otherwise unremarkable. Negative for abscess. Urinary bladder is physiologically distended, with resolution of the eccentric wall thickening seen previously. No free air. No ascites. No adenopathy localized. Lumbar spine unremarkable.  IMPRESSION: 1. Chronic distal sigmoid diverticulitis as before without abscess.   Electronically Signed   By: Oley Balm M.D.   On: 02/26/2014 21:22   Dg Abd Acute W/chest  02/26/2014   CLINICAL DATA:  Lower abdominal pain. Diverticulitis. Perforation with history  of fistula.  EXAM: ACUTE ABDOMEN SERIES (ABDOMEN 2 VIEW & CHEST 1 VIEW)  COMPARISON:  01/25/2014  FINDINGS: The lungs appear clear.  Cardiac and mediastinal contours normal.  No pleural effusion identified.  No free intraperitoneal gas collects beneath the hemidiaphragms. There are abnormal air-fluid levels in nondilated loops of small bowel in the right  upper quadrant and left abdomen. There is a paucity of gas and stool in the colon.  T-shaped IUD projects over the anatomic pelvis.  IMPRESSION: 1. Abnormal but nonspecific bowel gas pattern with air-fluid levels in nondilated loops of small bowel. This could be from a ileus or enteritis. Given the lack of dilated bowel, obstruction is considered less likely. No radiographic findings of free intraperitoneal gas at this time.   Electronically Signed   By: Herbie BaltimoreWalt  Liebkemann M.D.   On: 02/26/2014 18:09   Dg Colon W/cm - Wo/w Kub  03/02/2014   CLINICAL DATA:  Sigmoid colon stricture. Incomplete sigmoidoscopy yesterday. History of colovaginal fistula. Diverticulitis. Currently on antibiotics for diverticulitis.  EXAM: SINGLE COLUMN BARIUM ENEMA  TECHNIQUE: Initial scout AP supine abdominal image obtained to insure adequate colon cleansing. Barium was introduced into the colon in a retrograde fashion and refluxed from the rectum to the cecum. Spot images of the colon followed by overhead radiographs were obtained.  FLUOROSCOPY TIME:  Two Min and 7 seconds  COMPARISON:  CT abdomen pelvis 02/26/2014  FINDINGS: Moderate mucosal thickening of the sigmoid colon over approximately 10 cm length of colon. Based on prior CT scan, this has appearance of chronic diverticulitis. Barium passed readily through this narrowed segment. Negative for extravasation. No fistula identified. The rectum appears normal. The left colon above the narrowed segment is normal. No additional diverticular change in the colon. Remainder the colon has a smooth mucosal appearance. There is mild retained stool in the right colon however no mass lesion is identified. There is mild reflux into the appendix and terminal ileum which appear normal. IUD noted.  IMPRESSION: Mucosal edema in the sigmoid colon causing moderate narrowing. Appearance is most consistent with diverticular change. No focal mass is seen. No fistula or perforation. Review of the recent CT  also is consistent with diverticulitis change in the sigmoid colon.   Electronically Signed   By: Marlan Palauharles  Clark M.D.   On: 03/02/2014 13:01     Today   Subjective:   Aliz Dunbar today has no headache,no chest pain,,no new weakness tingling or numbness, feels much better wants to go home . Abdomen the pain much improved.  Objective:   Blood pressure 126/71, pulse 89, temperature 97.5 F (36.4 C), temperature source Oral, resp. rate 18, height 5' 6.5" (1.689 m), weight 65.772 kg (145 lb), SpO2 100.00%.  Intake/Output Summary (Last 24 hours) at 03/28/14 1738 Last data filed at 03/28/14 1400  Gross per 24 hour  Intake    100 ml  Output      0 ml  Net    100 ml    Exam Awake Alert, Oriented *3, No new F.N deficits, Normal affect [place].AT,PERRAL Supple Neck,No JVD, No cervical lymphadenopathy appriciated.  Symmetrical Chest wall movement, Good air movement bilaterally, CTAB RRR,No Gallops,Rubs or new Murmurs, No Parasternal Heave +ve B.Sounds, Abd Soft, , No organomegaly appriciated, No rebound -guarding or rigidity. Mild tenderness to palpation in the left side of the abdomen. No Cyanosis, Clubbing or edema, No new Rash or bruise  Data Review   Cultures -  CBC w Diff: Lab Results  Component Value  Date   WBC 7.5 03/28/2014   HGB 11.7* 03/28/2014   HCT 35.2* 03/28/2014   PLT 253 03/28/2014   LYMPHOPCT 30 03/27/2014   MONOPCT 6 03/27/2014   EOSPCT 2 03/27/2014   BASOPCT 0 03/27/2014   CMP: Lab Results  Component Value Date   NA 141 03/28/2014   K 4.0 03/28/2014   CL 108 03/28/2014   CO2 25 03/28/2014   BUN 8 03/28/2014   CREATININE 0.62 03/28/2014   PROT 7.8 03/27/2014   ALBUMIN 4.5 03/27/2014   BILITOT 0.2* 03/27/2014   ALKPHOS 57 03/27/2014   AST 25 03/27/2014   ALT 27 03/27/2014  .  Micro Results No results found for this or any previous visit (from the past 240 hour(s)).   Discharge Instructions     Keep your appointment with Dr. Romie Levee.  Follow-up Information    Follow up with Birdena Jubilee, MD In 1 week.   Specialty:  Family Medicine   Contact information:   [address] [place] [address] [phone]       Discharge Medications     Medication List         amitriptyline 25 MG tablet  Commonly known as:  ELAVIL  Take 50 mg by mouth at bedtime.     cephALEXin 500 MG capsule  Commonly known as:  KEFLEX  Take 500 mg by mouth 2 (two) times daily.     ciprofloxacin 500 MG tablet  Commonly known as:  CIPRO  Take 500 mg by mouth 2 (two) times daily.     etonogestrel 68 MG Impl implant  Commonly known as:  IMPLANON  Inject 1 each into the skin once.     HYDROcodone-acetaminophen 10-325 MG per tablet  Commonly known as:  NORCO  Take 2 tablets by mouth every 6 (six) hours.     ketoconazole 2 % cream  Commonly known as:  NIZORAL  Apply 1 application topically once as needed for irritation.     lisdexamfetamine 40 MG capsule  Commonly known as:  VYVANSE  Take 40 mg by mouth daily as needed (focus).     methocarbamol 750 MG tablet  Commonly known as:  ROBAXIN  Take 750 mg by mouth 3 (three) times daily as needed (pain).     metroNIDAZOLE 500 MG tablet  Commonly known as:  FLAGYL  Take 500 mg by mouth 3 (three) times daily.     traMADol 50 MG tablet  Commonly known as:  ULTRAM  Take 1 tablet (50 mg total) by mouth every 6 (six) hours as needed.     valsartan-hydrochlorothiazide 160-25 MG per tablet  Commonly known as:  DIOVAN-HCT  Take 1 tablet by mouth every morning.         Total Time in preparing paper work, data evaluation and todays exam - 35 minutes  Aidan Caloca M.D on 03/28/2014 at 5:38 PM  [facility] Office  [phone]

## 2014-03-28 NOTE — Care Management Note (Signed)
    Page 1 of 1   03/28/2014     4:45:43 PM CARE MANAGEMENT NOTE 03/28/2014  Patient:  Brandy Vaughn,Brandy Vaughn   Account Number:  [id number]  Date Initiated:  03/28/2014  Documentation initiated by:  DAVIS,RHONDA  Subjective/Objective Assessment:   CAT scan shows sigmoid diverticulitis without evidence of perforation./past 4 months  on constant ABx therapy to try and control chronic diverticulitis     Action/Plan:   Robotic assisted laparoscopic partial colectomy is already scheduled for 04/13/14.  stablize and home until surg [id number]   Anticipated DC Date:  03/31/2014   Anticipated DC Plan:  HOME/SELF CARE  In-house referral  NA      DC Planning Services  CM consult      PAC Choice  NA   Choice offered to / List presented to:             Status of service:  In process, will continue to follow Medicare Important Message given?   (If response is "NO", the following Medicare IM given date fields will be blank) Date Medicare IM given:   Medicare IM given by:   Date Additional Medicare IM given:   Additional Medicare IM given by:    Discharge Disposition:    Per UR Regulation:  Reviewed for med. necessity/level of care/duration of stay  If discussed at Long Length of Stay Meetings, dates discussed:    Comments:  [id number]/Rhonda Earlene Plateravis, RN, BSN, CCM Chart reviewed. Discharge needs and patient's stay to be reviewed and followed by case manager.

## 2014-03-28 NOTE — Progress Notes (Signed)
INITIAL NUTRITION ASSESSMENT  DOCUMENTATION CODES Per approved criteria  -Not Applicable   INTERVENTION: - Diet advancement per MD - Resource Breeze BID - RD to continue to monitor   NUTRITION DIAGNOSIS: Inadequate oral intake related to clear liquid diet as evidenced by diet order.   Goal: Advance diet as tolerated to soft diet  Monitor:  Weights, labs, diet advancement  Reason for Assessment: Malnutrition screening tool   38 y.o. female  Admitting Dx: Abdominal pain, left lower quadrant  ASSESSMENT: Pt presents to the ED with worsening abdominal pain over the past day. For the past 4 months the patient has been on constant antibiotic therapy to try and control chronic diverticulitis. She had 1 episode of diverticulitis last September, and a second episode of diverticulitis in July. She has developed a colovaginal fistula. Due to stricturing GI is unable to perform colonoscopy on the patient when they attempted on 03/01/14. Due to the patients ongoing problems and essentially failed medical management of chronic colitis: general surgery has made the decision to take the patient to the OR, Robotic assisted laparoscopic partial colectomy is already scheduled for 04/13/14. CT scan yesterday is essentially unchanged, shows what appears to be chronic diverticulitis of the sigmoid colon, malignancy cannot be ruled out (and wasn't able to be ruled out on colonoscopy as noted above).  - Pt reports having 4 months of on/off abdominal pain and nausea  - Said she had 3 episodes of vomiting 2 days ago, none yesterday or today - Reports she was eating small meals at home but a good dinner - Was drinking 1 protein shake a day she would make with protein powder - Reports 15 pound unintended weight loss in the past 4 months - Nutrition focused physical exam WNL - No nausea reported today     Height: Ht Readings from Last 1 Encounters:  03/27/14 5' 6.5" (1.689 m)    Weight: Wt Readings  from Last 1 Encounters:  03/27/14 145 lb (65.772 kg)    Ideal Body Weight: 130 lbs   % Ideal Body Weight: 111%  Wt Readings from Last 10 Encounters:  03/27/14 145 lb (65.772 kg)  03/01/14 146 lb (66.225 kg)  03/01/14 146 lb (66.225 kg)  01/23/14 146 lb 9.6 oz (66.497 kg)  01/04/14 151 lb (68.493 kg)  01/01/14 152 lb (68.947 kg)  12/23/13 150 lb (68.04 kg)  11/27/13 161 lb (73.029 kg)  07/11/13 163 lb (73.936 kg)    Usual Body Weight: 160 lbs per pt  % Usual Body Weight: 91%  BMI:  Body mass index is 23.06 kg/(m^2).  Estimated Nutritional Needs: Kcal: 1650-1850 Protein: 65-80g Fluid: 1.6-1.8L/day   Skin: intact   Diet Order: Clear Liquid  EDUCATION NEEDS: -No education needs identified at this time   Intake/Output Summary (Last 24 hours) at 03/28/14 1127 Last data filed at 03/28/14 0752  Gross per 24 hour  Intake      0 ml  Output      0 ml  Net      0 ml    Last BM: 9/29  Labs:   Recent Labs Lab 03/27/14 1757 03/28/14 0530  NA 139 141  K 3.8 4.0  CL 101 108  CO2 24 25  BUN 9 8  CREATININE 0.57 0.62  CALCIUM 9.6 8.0*  GLUCOSE 88 105*    CBG (last 3)  No results found for this basename: GLUCAP,  in the last 72 hours  Scheduled Meds: . amitriptyline  50 mg Oral  QHS  . cephALEXin  500 mg Oral BID  . ciprofloxacin  500 mg Oral BID  . valsartan  160 mg Oral Daily   And  . hydrochlorothiazide  25 mg Oral Daily  . metroNIDAZOLE  500 mg Oral TID  . nicotine  21 mg Transdermal Daily    Continuous Infusions: . sodium chloride 125 mL/hr at 03/28/14 1110    Past Medical History  Diagnosis Date  . Hypertension   . ADD (attention deficit disorder)   . Rosacea   . Vaginal tear resulting from childbirth     3rd degree  . Chronic back pain     on chronic narcotics  . Diverticulitis   . 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 Panic attacks     Past Surgical History  Procedure Laterality  Date  . Wisdom tooth extraction    . Colonoscopy with propofol N/A 03/01/2014    Procedure: COLONOSCOPY WITH PROPOFOL;  Surgeon: Charolett BumpersMartin K Johnson, MD;  Location: [facility];  Service: Endoscopy;  Laterality: N/A;    Charlott RakesHeather Chyna Kneece MS, RD, LDN [phone] Pager [phone] Weekend/After Hours Pager

## 2014-03-28 NOTE — Discharge Instructions (Signed)
Follow with Primary MD [name], ROBYN, MD in 7 days   Get CBC, CMP, KUB checked  by Primary MD next visit.    Activity: As tolerated with Full fall precautions use walker/cane & assistance as needed   Disposition Home    Diet: Heart Healthy  , with feeding assistance and aspiration precautions as needed.  For Heart failure patients - Check your Weight same time everyday, if you gain over 2 pounds, or you develop in leg swelling, experience more shortness of breath or chest pain, call your Primary MD immediately. Follow Cardiac Low Salt Diet and 1.8 lit/day fluid restriction.   On your next visit with her primary care physician please Get Medicines reviewed and adjusted.  Please request your Prim.MD to go over all Hospital Tests and Procedure/Radiological results at the follow up, please get all Hospital records sent to your Prim MD by signing hospital release before you go home.   If you experience worsening of your admission symptoms, develop shortness of breath, life threatening emergency, suicidal or homicidal thoughts you must seek medical attention immediately by calling 911 or calling your MD immediately  if symptoms less severe.  You Must read complete instructions/literature along with all the possible adverse reactions/side effects for all the Medicines you take and that have been prescribed to you. Take any new Medicines after you have completely understood and accpet all the possible adverse reactions/side effects.   Do not drive, operating heavy machinery, perform activities at heights, swimming or participation in water activities or provide baby sitting services if your were admitted for syncope or siezures until you have seen by Primary MD or a Neurologist and advised to do so again.  Do not drive when taking Pain medications.    Do not take more than prescribed Pain, Sleep and Anxiety Medications  Special Instructions: If you have smoked or chewed Tobacco  in the last 2  yrs please stop smoking, stop any regular Alcohol  and or any Recreational drug use.  Wear Seat belts while driving.   Please note  You were cared for by a hospitalist during your hospital stay. If you have any questions about your discharge medications or the care you received while you were in the hospital after you are discharged, you can call the unit and asked to speak with the hospitalist on call if the hospitalist that took care of you is not available. Once you are discharged, your primary care physician will handle any further medical issues. Please note that NO REFILLS for any discharge medications will be authorized once you are discharged, as it is imperative that you return to your primary care physician (or establish a relationship with a primary care physician if you do not have one) for your aftercare needs so that they can reassess your need for medications and monitor your lab values.

## 2014-03-28 NOTE — Progress Notes (Signed)
Discharge instructions reviewed with patient. Patient verbalizes understanding and has no questions at this time. Patient confirms she has all personal belongings in her possession. Patient discharged home.

## 2014-04-02 ENCOUNTER — Encounter (HOSPITAL_COMMUNITY): Payer: Self-pay | Admitting: Pharmacy Technician

## 2014-04-04 NOTE — Patient Instructions (Addendum)
Brandy Vaughn  04/04/2014   Your procedure is scheduled on:    Look [address]  Come the the Short Stay Center Entrance at 0530am.    Call this number if you have problems the morning of surgery: [phone]   Remember:   Do not eat food or drink liquids after midnight.   Take these medicines the morning of surgery with A SIP OF WATER: Hydrocodone if needed    Do not wear jewelry, make-up or nail polish.  Do not wear lotions, powders, or perfumes.  deodorant.  Do not shave 48 hours prior to surgery.   Do not bring valuables to the hospital.  Contacts, dentures or bridgework may not be worn into surgery.  Leave suitcase in the car. After surgery it may be brought to your room.  For patients admitted to the hospital, checkout time is 11:00 AM the day of  discharge.         Please read over the following fact sheets that you were given:  coughing and deep breathing exercises, leg exercises              Take a shower with DIAL SOAP nite before and morning of surgery.  Put clean sheets on your bed the nite before surgery and clean pajamas on nite before surgery.  The am of surgery take another with DIAL SOAP and  Put on clean clothes.

## 2014-04-05 ENCOUNTER — Encounter (HOSPITAL_COMMUNITY)
Admission: RE | Admit: 2014-04-05 | Discharge: 2014-04-05 | Disposition: A | Discharge status: Home / Self Care | Payer: Medicaid Other | Source: Ambulatory Visit | Attending: General Surgery | Admitting: General Surgery

## 2014-04-05 ENCOUNTER — Encounter (HOSPITAL_COMMUNITY): Payer: Self-pay

## 2014-04-05 DIAGNOSIS — I10 Essential (primary) hypertension: Secondary | ICD-10-CM

## 2014-04-05 DIAGNOSIS — M549 Dorsalgia, unspecified: Secondary | ICD-10-CM | POA: Insufficient documentation

## 2014-04-05 DIAGNOSIS — K5792 Diverticulitis of intestine, part unspecified, without perforation or abscess without bleeding: Secondary | ICD-10-CM | POA: Insufficient documentation

## 2014-04-05 DIAGNOSIS — Z0000 Encounter for general adult medical examination without abnormal findings: Secondary | ICD-10-CM | POA: Insufficient documentation

## 2014-04-05 DIAGNOSIS — F988 Other specified behavioral and emotional disorders with onset usually occurring in childhood and adolescence: Secondary | ICD-10-CM | POA: Diagnosis not present

## 2014-04-05 DIAGNOSIS — K632 Fistula of intestine: Secondary | ICD-10-CM | POA: Diagnosis not present

## 2014-04-05 DIAGNOSIS — R590 Localized enlarged lymph nodes: Secondary | ICD-10-CM | POA: Insufficient documentation

## 2014-04-05 DIAGNOSIS — F410 Panic disorder [episodic paroxysmal anxiety] without agoraphobia: Secondary | ICD-10-CM | POA: Diagnosis not present

## 2014-04-05 DIAGNOSIS — G8929 Other chronic pain: Secondary | ICD-10-CM | POA: Diagnosis not present

## 2014-04-05 DIAGNOSIS — L719 Rosacea, unspecified: Secondary | ICD-10-CM | POA: Insufficient documentation

## 2014-04-05 LAB — BASIC METABOLIC PANEL
Anion gap: 10 (ref 5–15)
BUN: 12 mg/dL (ref 6–23)
CO2: 27 mEq/L (ref 19–32)
Calcium: 8.9 mg/dL (ref 8.4–10.5)
Chloride: 101 mEq/L (ref 96–112)
Creatinine, Ser: 0.57 mg/dL (ref 0.50–1.10)
GFR calc Af Amer: >90 mL/min (ref >90)
GFR calc non Af Amer: >90 mL/min (ref >90)
Glucose, Bld: 98 mg/dL (ref 70–99)
POTASSIUM: 4.3 meq/L (ref 3.7–5.3)
SODIUM: 138 meq/L (ref 137–147)

## 2014-04-05 LAB — CBC
HEMATOCRIT: 41.6 % (ref 36.0–46.0)
HEMOGLOBIN: 13.5 g/dL (ref 12.0–15.0)
MCH: 32.3 pg (ref 26.0–34.0)
MCHC: 32.5 g/dL (ref 30.0–36.0)
MCV: 99.5 fL (ref 78.0–100.0)
Platelets: 300 10*3/uL (ref 150–400)
RBC: 4.18 MIL/uL (ref 3.87–5.11)
RDW: 12.4 % (ref 11.5–15.5)
WBC: 9.2 10*3/uL (ref 4.0–10.5)

## 2014-04-05 LAB — ABO/RH: ABO/RH(D): B POS

## 2014-04-05 LAB — HEMOGLOBIN A1C
HEMOGLOBIN A1C: 5.1 % (ref <5.7)
Mean Plasma Glucose: 100 mg/dL (ref <117)

## 2014-04-05 LAB — HCG, SERUM, QUALITATIVE: PREG SERUM: NEGATIVE

## 2014-04-05 NOTE — Progress Notes (Signed)
Kendall at office of [facility]CS at Dr Romie LeveeAlicia Thomas office- I made her aware of order in [facility] under Dr Maisie Fushomas that patient is allergic to CHG.  Patient at time of preop appointment today states she is not aware of an allergy to CHG or hibiclens.  I made Kendall aware and Penni BombardKendall will let Dr Maisie Fushomas be aware.  I instructed patient to shower with DIAL Soap  Nite before and am of surgery and placed on instructions.

## 2014-04-05 NOTE — Progress Notes (Signed)
Patient had Pneumonia vaccine week of 9/302015.  And patient has had a recent flu shot per patient.

## 2014-04-06 NOTE — Progress Notes (Addendum)
Final EKG in [facility] done 04/05/2014.   CT abd/pelvis- discusses lungs done 03/27/14 in [facility]  [facility]

## 2014-04-13 ENCOUNTER — Encounter (HOSPITAL_COMMUNITY): Payer: Medicaid Other | Admitting: Certified Registered Nurse Anesthetist

## 2014-04-13 ENCOUNTER — Encounter (HOSPITAL_COMMUNITY)
Admission: RE | Disposition: A | Discharge status: Home / Self Care | Payer: Self-pay | Source: Ambulatory Visit | Attending: General Surgery

## 2014-04-13 ENCOUNTER — Inpatient Hospital Stay (HOSPITAL_COMMUNITY): Payer: Medicaid Other | Admitting: Certified Registered Nurse Anesthetist

## 2014-04-13 ENCOUNTER — Encounter (HOSPITAL_COMMUNITY): Payer: Self-pay | Admitting: *Deleted

## 2014-04-13 ENCOUNTER — Inpatient Hospital Stay (HOSPITAL_COMMUNITY)
Admission: RE | Admit: 2014-04-13 | Discharge: 2014-04-17 | DRG: 330 | Disposition: A | Discharge status: Home / Self Care | Payer: Medicaid Other | Source: Ambulatory Visit | Attending: General Surgery | Admitting: General Surgery

## 2014-04-13 DIAGNOSIS — N824 Other female intestinal-genital tract fistulae: Secondary | ICD-10-CM | POA: Diagnosis present

## 2014-04-13 DIAGNOSIS — K5732 Diverticulitis of large intestine without perforation or abscess without bleeding: Secondary | ICD-10-CM | POA: Diagnosis present

## 2014-04-13 DIAGNOSIS — Z800 Family history of malignant neoplasm of digestive organs: Secondary | ICD-10-CM

## 2014-04-13 DIAGNOSIS — I10 Essential (primary) hypertension: Secondary | ICD-10-CM | POA: Diagnosis present

## 2014-04-13 DIAGNOSIS — Z87891 Personal history of nicotine dependence: Secondary | ICD-10-CM

## 2014-04-13 DIAGNOSIS — K5792 Diverticulitis of intestine, part unspecified, without perforation or abscess without bleeding: Secondary | ICD-10-CM | POA: Diagnosis present

## 2014-04-13 DIAGNOSIS — Z79891 Long term (current) use of opiate analgesic: Secondary | ICD-10-CM

## 2014-04-13 DIAGNOSIS — G8929 Other chronic pain: Secondary | ICD-10-CM | POA: Diagnosis present

## 2014-04-13 DIAGNOSIS — N823 Fistula of vagina to large intestine: Secondary | ICD-10-CM | POA: Diagnosis present

## 2014-04-13 DIAGNOSIS — Z6823 Body mass index (BMI) 23.0-23.9, adult: Secondary | ICD-10-CM

## 2014-04-13 DIAGNOSIS — F419 Anxiety disorder, unspecified: Secondary | ICD-10-CM | POA: Diagnosis present

## 2014-04-13 LAB — TYPE AND SCREEN
ABO/RH(D): B POS
Antibody Screen: NEGATIVE

## 2014-04-13 SURGERY — COLECTOMY, PARTIAL, ROBOT-ASSISTED, LAPAROSCOPIC
Anesthesia: General | Site: Abdomen

## 2014-04-13 MED ORDER — HEPARIN SODIUM (PORCINE) 5000 UNIT/ML IJ SOLN
5000.0000 [IU] | Freq: Once | INTRAMUSCULAR | Status: AC
  Administered 2014-04-13: 5000 [IU] via SUBCUTANEOUS
  Filled 2014-04-13: qty 1

## 2014-04-13 MED ORDER — HYDROMORPHONE HCL 2 MG/ML IJ SOLN
INTRAMUSCULAR | Status: AC
  Filled 2014-04-13: qty 1

## 2014-04-13 MED ORDER — ALUM & MAG HYDROXIDE-SIMETH 200-200-20 MG/5ML PO SUSP: 30.0000 mL | Freq: Four times a day (QID) | ORAL | Status: DC | PRN

## 2014-04-13 MED ORDER — CEFOTETAN DISODIUM 2 G IJ SOLR
2.0000 g | Freq: Two times a day (BID) | INTRAMUSCULAR | Status: AC
  Administered 2014-04-13: 2 g via INTRAVENOUS
  Filled 2014-04-13: qty 2

## 2014-04-13 MED ORDER — DEXAMETHASONE SODIUM PHOSPHATE 10 MG/ML IJ SOLN
INTRAMUSCULAR | Status: AC
  Filled 2014-04-13: qty 1

## 2014-04-13 MED ORDER — MORPHINE SULFATE 4 MG/ML IJ SOLN
INTRAMUSCULAR | Status: AC
  Filled 2014-04-13: qty 1

## 2014-04-13 MED ORDER — NEOSTIGMINE METHYLSULFATE 10 MG/10ML IV SOLN
INTRAVENOUS | Status: AC
  Filled 2014-04-13: qty 1

## 2014-04-13 MED ORDER — SODIUM CHLORIDE 0.9 % IV SOLN
INTRAVENOUS | Status: DC
  Administered 2014-04-13 (×2): via INTRAVENOUS
  Administered 2014-04-14: 75 mL/h via INTRAVENOUS
  Administered 2014-04-14 – 2014-04-15 (×2): via INTRAVENOUS
  Administered 2014-04-15: 75 mL via INTRAVENOUS

## 2014-04-13 MED ORDER — ONDANSETRON HCL 4 MG PO TABS: 4.0000 mg | ORAL_TABLET | Freq: Four times a day (QID) | ORAL | Status: DC | PRN

## 2014-04-13 MED ORDER — INDOCYANINE GREEN 25 MG IV SOLR
INTRAVENOUS | Status: DC | PRN
  Administered 2014-04-13: 2.5 mg via INTRAVENOUS

## 2014-04-13 MED ORDER — HYDROMORPHONE HCL 1 MG/ML IJ SOLN
INTRAMUSCULAR | Status: AC
  Filled 2014-04-13: qty 1

## 2014-04-13 MED ORDER — DIPHENHYDRAMINE HCL 50 MG/ML IJ SOLN
25.0000 mg | Freq: Four times a day (QID) | INTRAMUSCULAR | Status: DC | PRN
Start: 2014-04-13 — End: 2014-04-17

## 2014-04-13 MED ORDER — FENTANYL CITRATE 0.05 MG/ML IJ SOLN
INTRAMUSCULAR | Status: AC
  Filled 2014-04-13: qty 2

## 2014-04-13 MED ORDER — GLYCOPYRROLATE 0.2 MG/ML IJ SOLN
INTRAMUSCULAR | Status: AC
  Filled 2014-04-13: qty 2

## 2014-04-13 MED ORDER — GLYCOPYRROLATE 0.2 MG/ML IJ SOLN
INTRAMUSCULAR | Status: DC | PRN
  Administered 2014-04-13: 0.4 mg via INTRAVENOUS

## 2014-04-13 MED ORDER — FENTANYL CITRATE 0.05 MG/ML IJ SOLN
25.0000 ug | INTRAMUSCULAR | Status: DC | PRN
  Administered 2014-04-13 (×4): 25 ug via INTRAVENOUS

## 2014-04-13 MED ORDER — LACTATED RINGERS IR SOLN
Status: DC | PRN
  Administered 2014-04-13: 1000 mL

## 2014-04-13 MED ORDER — 0.9 % SODIUM CHLORIDE (POUR BTL) OPTIME
TOPICAL | Status: DC | PRN
  Administered 2014-04-13: 2000 mL

## 2014-04-13 MED ORDER — DEXTROSE 5 % IV SOLN
2.0000 g | INTRAVENOUS | Status: AC
  Administered 2014-04-13: 2 g via INTRAVENOUS
  Filled 2014-04-13: qty 2

## 2014-04-13 MED ORDER — KETOROLAC TROMETHAMINE 15 MG/ML IJ SOLN
15.0000 mg | Freq: Four times a day (QID) | INTRAMUSCULAR | Status: AC
  Administered 2014-04-13 – 2014-04-15 (×8): 15 mg via INTRAVENOUS
  Filled 2014-04-13 (×7): qty 1

## 2014-04-13 MED ORDER — NICOTINE 21 MG/24HR TD PT24
21.0000 mg | MEDICATED_PATCH | Freq: Every day | TRANSDERMAL | Status: DC
  Administered 2014-04-13 – 2014-04-17 (×5): 21 mg via TRANSDERMAL
  Filled 2014-04-13 (×5): qty 1

## 2014-04-13 MED ORDER — LIDOCAINE HCL (CARDIAC) 20 MG/ML IV SOLN
INTRAVENOUS | Status: AC
  Filled 2014-04-13: qty 5

## 2014-04-13 MED ORDER — ONDANSETRON HCL 4 MG/2ML IJ SOLN
INTRAMUSCULAR | Status: DC | PRN
  Administered 2014-04-13: 4 mg via INTRAVENOUS

## 2014-04-13 MED ORDER — FENTANYL CITRATE 0.05 MG/ML IJ SOLN
INTRAMUSCULAR | Status: DC | PRN
  Administered 2014-04-13: 100 ug via INTRAVENOUS
  Administered 2014-04-13 (×2): 50 ug via INTRAVENOUS
  Administered 2014-04-13: 100 ug via INTRAVENOUS
  Administered 2014-04-13 (×3): 50 ug via INTRAVENOUS
  Administered 2014-04-13: 100 ug via INTRAVENOUS
  Administered 2014-04-13: 50 ug via INTRAVENOUS

## 2014-04-13 MED ORDER — SCOPOLAMINE 1 MG/3DAYS TD PT72
MEDICATED_PATCH | TRANSDERMAL | Status: AC
  Filled 2014-04-13: qty 1

## 2014-04-13 MED ORDER — LISDEXAMFETAMINE DIMESYLATE 20 MG PO CAPS: 40.0000 mg | ORAL_CAPSULE | Freq: Every day | ORAL | Status: DC | PRN

## 2014-04-13 MED ORDER — MORPHINE SULFATE 4 MG/ML IJ SOLN
4.0000 mg | Freq: Once | INTRAMUSCULAR | Status: AC
  Administered 2014-04-13: 4 mg via INTRAVENOUS
  Filled 2014-04-13: qty 1

## 2014-04-13 MED ORDER — MORPHINE SULFATE 2 MG/ML IJ SOLN
2.0000 mg | INTRAMUSCULAR | Status: DC | PRN
  Administered 2014-04-13 – 2014-04-14 (×5): 4 mg via INTRAVENOUS
  Filled 2014-04-13 (×4): qty 2

## 2014-04-13 MED ORDER — LACTATED RINGERS IV SOLN: INTRAVENOUS | Status: DC

## 2014-04-13 MED ORDER — MIDAZOLAM HCL 2 MG/2ML IJ SOLN
INTRAMUSCULAR | Status: AC
  Filled 2014-04-13: qty 2

## 2014-04-13 MED ORDER — KETOROLAC TROMETHAMINE 15 MG/ML IJ SOLN
15.0000 mg | Freq: Four times a day (QID) | INTRAMUSCULAR | Status: DC | PRN
  Administered 2014-04-15 – 2014-04-16 (×2): 15 mg via INTRAVENOUS
  Filled 2014-04-13 (×2): qty 1

## 2014-04-13 MED ORDER — ONDANSETRON HCL 4 MG/2ML IJ SOLN
INTRAMUSCULAR | Status: AC
  Filled 2014-04-13: qty 2

## 2014-04-13 MED ORDER — ACETAMINOPHEN 10 MG/ML IV SOLN
1000.0000 mg | Freq: Four times a day (QID) | INTRAVENOUS | Status: AC
  Administered 2014-04-13 – 2014-04-14 (×3): 1000 mg via INTRAVENOUS
  Filled 2014-04-13 (×6): qty 100

## 2014-04-13 MED ORDER — PROPOFOL 10 MG/ML IV BOLUS
INTRAVENOUS | Status: AC
  Filled 2014-04-13: qty 20

## 2014-04-13 MED ORDER — FENTANYL CITRATE 0.05 MG/ML IJ SOLN
INTRAMUSCULAR | Status: AC
  Filled 2014-04-13: qty 5

## 2014-04-13 MED ORDER — SCOPOLAMINE 1 MG/3DAYS TD PT72
MEDICATED_PATCH | TRANSDERMAL | Status: DC | PRN
  Administered 2014-04-13: 1 via TRANSDERMAL

## 2014-04-13 MED ORDER — SUCCINYLCHOLINE CHLORIDE 20 MG/ML IJ SOLN
INTRAMUSCULAR | Status: DC | PRN
  Administered 2014-04-13: 100 mg via INTRAVENOUS

## 2014-04-13 MED ORDER — DIPHENHYDRAMINE HCL 25 MG PO CAPS
25.0000 mg | ORAL_CAPSULE | Freq: Four times a day (QID) | ORAL | Status: DC | PRN
  Administered 2014-04-14: 25 mg via ORAL
  Filled 2014-04-13: qty 1

## 2014-04-13 MED ORDER — HYDROMORPHONE HCL 1 MG/ML IJ SOLN
0.2500 mg | INTRAMUSCULAR | Status: DC | PRN
  Administered 2014-04-13 (×4): 0.5 mg via INTRAVENOUS

## 2014-04-13 MED ORDER — BUPIVACAINE-EPINEPHRINE 0.25% -1:200000 IJ SOLN
INTRAMUSCULAR | Status: DC | PRN
  Administered 2014-04-13: 30 mL

## 2014-04-13 MED ORDER — AMITRIPTYLINE HCL 50 MG PO TABS
50.0000 mg | ORAL_TABLET | Freq: Every day | ORAL | Status: DC
  Administered 2014-04-14 – 2014-04-16 (×3): 50 mg via ORAL
  Filled 2014-04-13 (×5): qty 1

## 2014-04-13 MED ORDER — LIDOCAINE HCL 1 % IJ SOLN
INTRAMUSCULAR | Status: DC | PRN
  Administered 2014-04-13: 80 mg via INTRADERMAL

## 2014-04-13 MED ORDER — KETOROLAC TROMETHAMINE 15 MG/ML IJ SOLN
INTRAMUSCULAR | Status: AC
  Filled 2014-04-13: qty 1

## 2014-04-13 MED ORDER — ACETAMINOPHEN 500 MG PO TABS
1000.0000 mg | ORAL_TABLET | Freq: Four times a day (QID) | ORAL | Status: DC
  Administered 2014-04-13: 1000 mg via ORAL
  Filled 2014-04-13 (×2): qty 2

## 2014-04-13 MED ORDER — ALVIMOPAN 12 MG PO CAPS
12.0000 mg | ORAL_CAPSULE | Freq: Once | ORAL | Status: AC
  Administered 2014-04-13: 12 mg via ORAL
  Filled 2014-04-13: qty 1

## 2014-04-13 MED ORDER — HYDROMORPHONE HCL 1 MG/ML IJ SOLN
INTRAMUSCULAR | Status: DC | PRN
  Administered 2014-04-13 (×2): 1 mg via INTRAVENOUS

## 2014-04-13 MED ORDER — ENOXAPARIN SODIUM 40 MG/0.4ML ~~LOC~~ SOLN
40.0000 mg | SUBCUTANEOUS | Status: DC
  Administered 2014-04-14 – 2014-04-17 (×4): 40 mg via SUBCUTANEOUS
  Filled 2014-04-13 (×5): qty 0.4

## 2014-04-13 MED ORDER — ALVIMOPAN 12 MG PO CAPS
12.0000 mg | ORAL_CAPSULE | Freq: Two times a day (BID) | ORAL | Status: DC
Start: 2014-04-14 — End: 2014-04-14
  Administered 2014-04-14: 12 mg via ORAL
  Filled 2014-04-13 (×2): qty 1

## 2014-04-13 MED ORDER — SACCHAROMYCES BOULARDII 250 MG PO CAPS
250.0000 mg | ORAL_CAPSULE | Freq: Every day | ORAL | Status: DC
  Administered 2014-04-14 – 2014-04-17 (×4): 250 mg via ORAL
  Filled 2014-04-13 (×4): qty 1

## 2014-04-13 MED ORDER — CISATRACURIUM BESYLATE (PF) 10 MG/5ML IV SOLN
INTRAVENOUS | Status: DC | PRN
  Administered 2014-04-13: 6 mg via INTRAVENOUS
  Administered 2014-04-13: 10 mg via INTRAVENOUS
  Administered 2014-04-13: 4 mg via INTRAVENOUS

## 2014-04-13 MED ORDER — HYDROCODONE-ACETAMINOPHEN 5-325 MG PO TABS
1.0000 | ORAL_TABLET | ORAL | Status: DC | PRN
  Administered 2014-04-14 – 2014-04-15 (×4): 2 via ORAL
  Filled 2014-04-13 (×5): qty 2

## 2014-04-13 MED ORDER — NEOSTIGMINE METHYLSULFATE 10 MG/10ML IV SOLN
INTRAVENOUS | Status: DC | PRN
  Administered 2014-04-13: 4 mg via INTRAVENOUS

## 2014-04-13 MED ORDER — MIDAZOLAM HCL 5 MG/5ML IJ SOLN
INTRAMUSCULAR | Status: DC | PRN
  Administered 2014-04-13: 2 mg via INTRAVENOUS

## 2014-04-13 MED ORDER — BUPIVACAINE-EPINEPHRINE (PF) 0.25% -1:200000 IJ SOLN
INTRAMUSCULAR | Status: AC
  Filled 2014-04-13: qty 30

## 2014-04-13 MED ORDER — CEFOTETAN DISODIUM-DEXTROSE 2-2.08 GM-% IV SOLR
INTRAVENOUS | Status: AC
  Filled 2014-04-13: qty 50

## 2014-04-13 MED ORDER — PROMETHAZINE HCL 25 MG/ML IJ SOLN: 6.2500 mg | INTRAMUSCULAR | Status: DC | PRN

## 2014-04-13 MED ORDER — HYDROMORPHONE HCL 1 MG/ML IJ SOLN: 0.5000 mg | INTRAMUSCULAR | Status: DC | PRN

## 2014-04-13 MED ORDER — LACTATED RINGERS IV SOLN
INTRAVENOUS | Status: DC | PRN
  Administered 2014-04-13 (×3): via INTRAVENOUS

## 2014-04-13 MED ORDER — PROPOFOL 10 MG/ML IV BOLUS
INTRAVENOUS | Status: DC | PRN
  Administered 2014-04-13: 160 mg via INTRAVENOUS
  Administered 2014-04-13: 40 mg via INTRAVENOUS

## 2014-04-13 MED ORDER — DEXAMETHASONE SODIUM PHOSPHATE 10 MG/ML IJ SOLN
INTRAMUSCULAR | Status: DC | PRN
  Administered 2014-04-13: 10 mg via INTRAVENOUS

## 2014-04-13 MED ORDER — ONDANSETRON HCL 4 MG/2ML IJ SOLN: 4.0000 mg | Freq: Four times a day (QID) | INTRAMUSCULAR | Status: DC | PRN

## 2014-04-13 SURGICAL SUPPLY — 96 items
ADH SKN CLS APL DERMABOND .7 (GAUZE/BANDAGES/DRESSINGS) ×2
BLADE EXTENDED COATED 6.5IN (ELECTRODE) ×2 IMPLANT
BLADE SURG SZ11 CARB STEEL (BLADE) ×2 IMPLANT
CANNULA REDUC XI 12-8 STAPL (CANNULA)
CANNULA REDUCER 12-8 DVNC XI (CANNULA) IMPLANT
CELLS DAT CNTRL 66122 CELL SVR (MISCELLANEOUS) IMPLANT
CHLORAPREP W/TINT 26ML (MISCELLANEOUS) ×2 IMPLANT
CLIP LIGATING HEM O LOK PURPLE (MISCELLANEOUS) IMPLANT
CLIP LIGATING HEMOLOK MED (MISCELLANEOUS) IMPLANT
COVER MAYO STAND STRL (DRAPES) ×3 IMPLANT
COVER TIP SHEARS 8 DVNC (MISCELLANEOUS) ×1 IMPLANT
COVER TIP SHEARS 8MM DA VINCI (MISCELLANEOUS) ×1
DECANTER SPIKE VIAL GLASS SM (MISCELLANEOUS) ×2 IMPLANT
DERMABOND ADVANCED (GAUZE/BANDAGES/DRESSINGS) ×2
DERMABOND ADVANCED .7 DNX12 (GAUZE/BANDAGES/DRESSINGS) IMPLANT
DEVICE TROCAR PUNCTURE CLOSURE (ENDOMECHANICALS) ×1 IMPLANT
DRAPE ARM DVNC X/XI (DISPOSABLE) ×4 IMPLANT
DRAPE COLUMN DVNC XI (DISPOSABLE) ×1 IMPLANT
DRAPE DA VINCI XI ARM (DISPOSABLE) ×4
DRAPE DA VINCI XI COLUMN (DISPOSABLE) ×1
DRAPE SHEET LG 3/4 BI-LAMINATE (DRAPES) ×2 IMPLANT
DRAPE SURG IRRIG POUCH 19X23 (DRAPES) ×2 IMPLANT
DRAPE WARM FLUID 44X44 (DRAPE) ×2 IMPLANT
DRSG OPSITE POSTOP 4X10 (GAUZE/BANDAGES/DRESSINGS) IMPLANT
DRSG OPSITE POSTOP 4X6 (GAUZE/BANDAGES/DRESSINGS) ×1 IMPLANT
DRSG OPSITE POSTOP 4X8 (GAUZE/BANDAGES/DRESSINGS) IMPLANT
ELECT PENCIL ROCKER SW 15FT (MISCELLANEOUS) ×4 IMPLANT
ELECT REM PT RETURN 15FT ADLT (MISCELLANEOUS) ×1 IMPLANT
ENDOLOOP SUT PDS II  0 18 (SUTURE)
ENDOLOOP SUT PDS II 0 18 (SUTURE) IMPLANT
EVACUATOR SILICONE 100CC (DRAIN) IMPLANT
GAUZE SPONGE 4X4 12PLY STRL (GAUZE/BANDAGES/DRESSINGS) IMPLANT
GLOVE BIO SURGEON STRL SZ 6.5 (GLOVE) ×6 IMPLANT
GLOVE BIOGEL PI IND STRL 7.0 (GLOVE) ×3 IMPLANT
GLOVE BIOGEL PI INDICATOR 7.0 (GLOVE) ×3
GOWN STRL REUS W/TWL 2XL LVL3 (GOWN DISPOSABLE) ×6 IMPLANT
GOWN STRL REUS W/TWL XL LVL3 (GOWN DISPOSABLE) ×12 IMPLANT
HOLDER FOLEY CATH W/STRAP (MISCELLANEOUS) ×2 IMPLANT
KIT BASIN OR (CUSTOM PROCEDURE TRAY) ×4 IMPLANT
LEGGING LITHOTOMY PAIR STRL (DRAPES) ×2 IMPLANT
NDL INSUFFLATION 14GA 120MM (NEEDLE) ×1 IMPLANT
NEEDLE HYPO 22GX1.5 SAFETY (NEEDLE) ×2 IMPLANT
NEEDLE INSUFFLATION 14GA 120MM (NEEDLE) ×2 IMPLANT
PACK CARDIOVASCULAR III (CUSTOM PROCEDURE TRAY) ×2 IMPLANT
PACK GENERAL/GYN (CUSTOM PROCEDURE TRAY) ×2 IMPLANT
PORT LAP GEL ALEXIS MED 5-9CM (MISCELLANEOUS) ×1 IMPLANT
RELOAD STAPLE 45 BLU REG DVNC (STAPLE) IMPLANT
RELOAD STAPLE 45 GRN THCK DVNC (STAPLE) IMPLANT
RETRACTOR WND ALEXIS 18 MED (MISCELLANEOUS) IMPLANT
RTRCTR WOUND ALEXIS 18CM MED (MISCELLANEOUS)
SCISSORS LAP 5X45 EPIX DISP (ENDOMECHANICALS) ×2 IMPLANT
SEAL CANN UNIV 5-8 DVNC XI (MISCELLANEOUS) ×3 IMPLANT
SEAL XI 5MM-8MM UNIVERSAL (MISCELLANEOUS) ×4
SEALER VESSEL DA VINCI XI (MISCELLANEOUS) ×2
SEALER VESSEL EXT DVNC XI (MISCELLANEOUS) ×1 IMPLANT
SET IRRIG TUBING LAPAROSCOPIC (IRRIGATION / IRRIGATOR) ×2 IMPLANT
SLEEVE XCEL OPT CAN 5 100 (ENDOMECHANICALS) ×1 IMPLANT
SOLUTION ELECTROLUBE (MISCELLANEOUS) ×2 IMPLANT
SPONGE LAP 18X18 X RAY DECT (DISPOSABLE) IMPLANT
STAPLER 45 BLU RELOAD XI (STAPLE) ×2 IMPLANT
STAPLER 45 BLUE RELOAD XI (STAPLE) ×2
STAPLER 45 GREEN RELOAD XI (STAPLE)
STAPLER 45 GRN RELOAD XI (STAPLE) IMPLANT
STAPLER CANNULA SEAL DVNC XI (STAPLE) IMPLANT
STAPLER CANNULA SEAL XI (STAPLE)
STAPLER CIRC ILS CVD 33MM 37CM (STAPLE) ×1 IMPLANT
STAPLER SHEATH (SHEATH) ×1
STAPLER SHEATH ENDOWRIST DVNC (SHEATH) IMPLANT
STAPLER VISISTAT 35W (STAPLE) ×2 IMPLANT
SUCTION POOLE TIP (SUCTIONS) ×2 IMPLANT
SUT NOVA NAB DX-16 0-1 5-0 T12 (SUTURE) ×2 IMPLANT
SUT PDS AB 1 CTX 36 (SUTURE) IMPLANT
SUT PDS AB 1 TP1 96 (SUTURE) IMPLANT
SUT PROLENE 2 0 KS (SUTURE) ×3 IMPLANT
SUT SILK 2 0 (SUTURE) ×2
SUT SILK 2 0 SH CR/8 (SUTURE) ×2 IMPLANT
SUT SILK 2-0 18XBRD TIE 12 (SUTURE) ×1 IMPLANT
SUT SILK 3 0 (SUTURE) ×2
SUT SILK 3 0 SH CR/8 (SUTURE) ×2 IMPLANT
SUT SILK 3-0 18XBRD TIE 12 (SUTURE) ×1 IMPLANT
SUT VIC AB 2-0 SH 18 (SUTURE) ×1 IMPLANT
SUT VIC AB 2-0 SH 27 (SUTURE) ×2
SUT VIC AB 2-0 SH 27X BRD (SUTURE) IMPLANT
SUT VIC AB 4-0 PS2 27 (SUTURE) ×4 IMPLANT
SUT VICRYL 0 UR6 27IN ABS (SUTURE) ×1 IMPLANT
SYR 20CC LL (SYRINGE) ×2 IMPLANT
SYRINGE 10CC LL (SYRINGE) ×2 IMPLANT
SYS LAPSCP GELPORT 120MM (MISCELLANEOUS)
SYSTEM LAPSCP GELPORT 120MM (MISCELLANEOUS) IMPLANT
TOWEL OR 17X26 10 PK STRL BLUE (TOWEL DISPOSABLE) ×4 IMPLANT
TOWEL OR NON WOVEN STRL DISP B (DISPOSABLE) ×2 IMPLANT
TRAY FOLEY CATH 14FRSI W/METER (CATHETERS) ×2 IMPLANT
TROCAR BLADELESS OPT 5 100 (ENDOMECHANICALS) ×2 IMPLANT
TUBING CONNECTING 10 (TUBING) IMPLANT
TUBING FILTER THERMOFLATOR (ELECTROSURGICAL) ×2 IMPLANT
YANKAUER SUCT BULB TIP 10FT TU (MISCELLANEOUS) ×2 IMPLANT

## 2014-04-13 NOTE — Progress Notes (Addendum)
Patient rates pain at an 8 crying stating she is in a lot of pain and can not wait for prn dose of medication.  Dr Maisie Fushomas paged await response.  Orders received via OR nurse

## 2014-04-13 NOTE — Op Note (Signed)
04/13/2014  11:03 AM  PATIENT:  Brandy Vaughn  38 y.o. female  Patient Care Team: Kaleen MaskWilson Oliver Elkins, MD as PCP - General (Family Medicine)  PRE-OPERATIVE DIAGNOSIS:  Diverticular stricture, colovaginal fistula  POST-OPERATIVE DIAGNOSIS:  Diverticular stricture, colovaginal fistula  PROCEDURE:  XI ROBOT ASSISTED SIGMOID COLECTOMY  SURGEON:  Surgeon(s): Romie LeveeAlicia Amillion Scobee, MD Karie SodaSteven Gross, MD  ASSISTANT: Michaell CowingGross   ANESTHESIA:   local and general  EBL:  Total I/O In: 2000 [I.V.:2000] Out: 475 [Urine:475]  Delay start of Pharmacological VTE agent (>24hrs) due to surgical blood loss or risk of bleeding:  no  DRAINS: none   SPECIMEN:  Source of Specimen:  Sigmoid colon  DISPOSITION OF SPECIMEN:  PATHOLOGY  COUNTS:  YES  PLAN OF CARE: Admit to inpatient   PATIENT DISPOSITION:  PACU - hemodynamically stable.  INDICATION:    This is a 38 y.o. F with known diverticular disease and a colovaginal fistula.  I recommended segmental resection:  The anatomy & physiology of the digestive tract was discussed.  The pathophysiology was discussed.  Natural history risks without surgery was discussed.   I worked to give an overview of the disease and the frequent need to have multispecialty involvement.  I feel the risks of no intervention will lead to serious problems that outweigh the operative risks; therefore, I recommended a partial colectomy to remove the pathology.  Laparoscopic & open techniques were discussed.   Risks such as bleeding, infection, abscess, leak, reoperation, possible ostomy, hernia, heart attack, death, and other risks were discussed.  I noted a good likelihood this will help address the problem.   Goals of post-operative recovery were discussed as well.    The patient expressed understanding & wished to proceed with surgery.  OR FINDINGS:   Patient had a definite diverticular stricture at the recto-sigmoid junction.     The anastomosis rests ~11 cm from the  anal verge by rigid proctoscopy.  DESCRIPTION:   Informed consent was confirmed.  The patient underwent general anaesthesia without difficulty.  The patient was positioned appropriately.  VTE prevention in place.  The patient's abdomen was clipped, prepped, & draped in a sterile fashion.  Surgical timeout confirmed our plan.  The patient was positioned in reverse Trendelenburg.  Abdominal entry was gained using Veress needle in the LUQ.  Entry was clean.  I induced carbon dioxide insufflation. I placed an 8mm robotic port in the RUQ. Camera inspection revealed no injury.  Extra 8mm ports were carefully placed under direct laparoscopic visualization.  A 12mm port was placed in the RLQ.     I reflected the greater omentum and the upper abdomen the small bowel in the upper abdomen. I docked the robot and connected the instruments.  I then scored the base of peritoneum of the right side of the mesentery of the left colon. The patient had an adhesion to the left side of her pelvis.  This was carefully dissected free.  Both ureters were identified and preserved.  After the sigmoid could be elevated out of the pelvis, I elevated the sigmoid mesentery and enetered into the retro-mesenteric plane. We were able to identify the left ureter and gonadal vessels. We kept those posterior within the retroperitoneum and elevated the left colon mesentery off that. I did isolated IMA pedicle but did not ligate it yet.  I continued distally and got into the avascular plane posterior to the mesorectum. This allowed me to help mobilize the rectum as well by freeing the mesorectum  off the sacrum.  I mobilized the peritoneal coverings towards the peritoneal reflection on both the right and left sides of the rectum.  I could see the right and left ureters and stayed away from them.    I skeletonized the inferior mesenteric artery pedicle.  I went down to 2cm distal to its takeoff from the aorta.  After confirming the left ureter  was out of the way, I went ahead and ligated the inferior mesenteric artery pedicle with the robotic vessel sealer ~2cm above its takeoff from the aorta.   We ensured hemostasis. I skeletonized the mesorectum at the junction at the proximal rectum using blunt dissection & the vessel sealer.  I mobilized the left colon in a lateral to medial fashion off the line of Toldt up towards the splenic flexure to ensure good mobilization of the left colon to reach into the pelvis.  I then stapled the rectum using 2 blue loads of the robotic stapler.  I used firefly to ensure the distal left colon was perfused and identified a portion of distal colon to transect that lit up well with the firefly.  I skeletonization the mesentery to this portion of colon.  I then made a pfannenstiel incision ~2 cm above her pubic bone at midline.  An alexis wound protector was placed.  The distal colon was removed.  I purse stringer device was used and the distal L colon was transected using a scalpel.  There was good mesenteric bleeding.  A 2-0 prolene was placed and secured with interrupted 3-0 silk sutures.  A 33mm EEA anvil was inserted, and the pursestring was tied around this.  This was dropped back in the pelvis and the abdomen was insufflated.  The EEA stapler was passed up the rectum and connected to the anvil out the L anterior distal stump.  The stapler was fired and removed.  There was no leak of air under water when the anastomosis was tested with insufflation.  The abdomen was irrigated.  The instruments were removed.  The LLQ 12 mm port was closed laparoscopically with an 0 Vicryl stitch prior to this.  We switched to clean instruments and drapes.  The pfannenstiel incision was closed using a 2-0 running Vicryl for the peritoneum.  The fascia was closed using 2 #1 Novofil sutures.  The subcutaneous layer was closed with interrupted 2-0 Vicryl sutures.  The skin incisions were all closed using 4-0 Vicryl sutures.  THe port sites  were closed with a dermabond.  A sterile dressing was applied to the extraction site. The patient was awakened from anesthesia and sent to the PACU in stable condition.  All counts were correct per OR staff.

## 2014-04-13 NOTE — Anesthesia Preprocedure Evaluation (Signed)
Anesthesia Evaluation  Patient identified by MRN, date of birth, ID band Patient awake    Reviewed: Allergy & Precautions, H&P , NPO status , Patient's Chart, lab work & pertinent test results  Airway Mallampati: II TM Distance: >3 FB Neck ROM: Full    Dental no notable dental hx.    Pulmonary former smoker,  breath sounds clear to auscultation  Pulmonary exam normal       Cardiovascular hypertension, Pt. on medications Rhythm:Regular Rate:Normal     Neuro/Psych PSYCHIATRIC DISORDERS Anxiety negative neurological ROS     GI/Hepatic negative GI ROS, Neg liver ROS,   Endo/Other  negative endocrine ROS  Renal/GU negative Renal ROS  negative genitourinary   Musculoskeletal negative musculoskeletal ROS (+)   Abdominal   Peds negative pediatric ROS (+)  Hematology negative hematology ROS (+)   Anesthesia Other Findings   Reproductive/Obstetrics negative OB ROS                           Anesthesia Physical Anesthesia Plan  ASA: II  Anesthesia Plan: General   Post-op Pain Management:    Induction: Intravenous  Airway Management Planned: Oral ETT  Additional Equipment:   Intra-op Plan:   Post-operative Plan: Extubation in OR  Informed Consent: I have reviewed the patients History and Physical, chart, labs and discussed the procedure including the risks, benefits and alternatives for the proposed anesthesia with the patient or authorized representative who has indicated his/her understanding and acceptance.   Dental advisory given  Plan Discussed with: CRNA  Anesthesia Plan Comments:         Anesthesia Quick Evaluation

## 2014-04-13 NOTE — Transfer of Care (Signed)
Immediate Anesthesia Transfer of Care Note  Patient: Brandy Vaughn  Procedure(s) Performed: Procedure(s): XI ROBOT ASSISTED LAPAROSCOPIC SIGMOID COLECTOMY AND LYSIS OF ADHESIONS WITH RIGID PROCTOSCOPY (N/A)  Patient Location: PACU  Anesthesia Type:General  Level of Consciousness: awake, alert  and oriented  Airway & Oxygen Therapy: Patient connected to face mask oxygen  Post-op Assessment: Report given to PACU RN  Post vital signs: Reviewed and stable  Complications: No apparent anesthesia complications

## 2014-04-13 NOTE — H&P (Signed)
Chief Complaint   Patient presents with   .  Other     new pt- eval fistula   HISTORY: Brandy Vaughn is a 38 y.o. female who presents to the office with diverticulitis. 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 She saw Dr Laural BenesJohnson but did not get a colonoscopy due to finances. She is still having abdominal pain. No blood in stool. Her Bm's are regular and loose. She denies a h/o constipation and does take chronic narcotics for back pain. She is currently taking cipro and flagyl. She is to get her IUD removed on Aug 6. She does have a FH of colon cancer in her mother at around 40 yo.  Past Medical History   Diagnosis  Date   .  Hypertension    .  ADD (attention deficit disorder)    .  Rosacea    .  Vaginal tear resulting from childbirth      3rd degree   .  Chronic back pain      on chronic narcotics    Past Surgical History   Procedure  Laterality  Date   .  Wisdom tooth extraction      Current Outpatient Prescriptions   Medication  Sig  Dispense  Refill   .  acetaminophen (TYLENOL) 325 MG tablet  Take 650 mg by mouth every 6 (six) hours as needed (pain).     .  ciprofloxacin (CIPRO) 500 MG tablet  Take 1 tablet (500 mg total) by mouth 2 (two) times daily.  42 tablet  0   .  HYDROcodone-acetaminophen (NORCO) 10-325 MG per tablet  Take 1 tablet by mouth 3 (three) times daily.  15 tablet  0   .  lisdexamfetamine (VYVANSE) 40 MG capsule  Take 1 capsule (40 mg total) by mouth daily as needed (for focus).  30 capsule  0   .  methocarbamol (ROBAXIN) 750 MG tablet  Take 750 mg by mouth 3 (three) times daily as needed for muscle spasms. For back pain     .  metroNIDAZOLE (FLAGYL) 500 MG tablet  Take 1 tablet (500 mg total) by mouth 3 (three) times daily.  63 tablet  0   .  Multiple Vitamin (MULTIVITAMIN WITH MINERALS) TABS tablet  Take 1 tablet by mouth daily.     .  nicotine (NICODERM CQ - DOSED IN MG/24 HOURS) 14 mg/24hr patch  Place 14 mg onto the  skin daily.     .  ondansetron (ZOFRAN) 4 MG tablet  Take 1 tablet (4 mg total) by mouth every 6 (six) hours.  12 tablet  0   .  oxyCODONE (OXY IR/ROXICODONE) 5 MG immediate release tablet  Take 1-2 tablets (5-10 mg total) by mouth every 6 (six) hours as needed for moderate pain, severe pain or breakthrough pain.  40 tablet  0   .  terbinafine (LAMISIL) 250 MG tablet  Take 1 tablet (250 mg total) by mouth daily.  90 tablet  0   .  valsartan-hydrochlorothiazide (DIOVAN HCT) 160-25 MG per tablet  Take 1 tablet by mouth daily.  30 tablet  11    No current facility-administered medications for this visit.   No Known Allergies  Family History   Problem  Relation  Age of Onset   .  Cancer  Mother      Colon, Cervical   .  Hypertension  Father    .  Osteoporosis  Paternal Grandmother    .  Hypertension  Paternal Grandmother    .  Cancer  Paternal Grandfather      Bladder, Throat    History    Social History   .  Marital Status:  Divorced     Spouse Name:  N/A     Number of Children:  N/A   .  Years of Education:  N/A    Social History Main Topics   .  Smoking status:  Former Smoker -- 0.50 packs/day     Types:  Cigarettes   .  Smokeless tobacco:  Current User      Comment: She is trying E-Cigarrettes to quit smoking   .  Alcohol Use:  No   .  Drug Use:  No   .  Sexual Activity:  Yes     Partners:  Male    Other Topics  Concern   .  None    Social History Narrative    Marital Status: Separated Chief Technology Officer)    Children: Daughters (2)    Pets: Dog (Zoey)    Living Situation: Lives with children.    Occupation: Unemployed    EducationManufacturing engineer    Tobacco Use/Exposure: She smokes 1/2 ppd.    Alcohol Use: Occasional    Drug Use: None    Diet: Regular    Exercise: Limited    Hobbies: Reading   REVIEW OF SYSTEMS - PERTINENT POSITIVES ONLY:  Review of Systems - General ROS: negative for - chills or fever, positive for fatigue  Respiratory ROS: no cough, shortness of  breath, or wheezing  Cardiovascular ROS: no chest pain or dyspnea on exertion  Gastrointestinal ROS: positive for - abdominal pain  negative for - blood in stools, change in bowel habits or constipation  Genito-Urinary ROS: no dysuria, trouble voiding, or hematuria  EXAM:  BP 124/74  Pulse 107  Temp(Src) 98.4 F (36.9 C) (Oral)  Resp 16  SpO2 97% General appearance: alert and cooperative  Resp: clear to auscultation bilaterally  Cardio: regular rate and rhythm  GI: soft, non-tender; bowel sounds normal; no masses, no organomegaly  RADIOLOGY RESULTS:  Images and reports are reviewed.  IMPRESSION:  1. Linear tract of extraluminal stranding, fluid and tiny locular of  gas extending from the sigmoid colon to the left posterolateral  aspect of vaginal fornix highly concerning for colovaginal fistula  consistent with the patient's clinical symptoms.  2. An IUD is present. Currently, there is no inflammatory change  surrounding the uterus itself. However, consideration should be  given to removal of the IUD as the presence of this foreign body in  the setting of a colovaginal fistula may increase the risk of  developing endometritis.  3. 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 Overall, the degree of inflammation  involving the sigmoid colon appears slightly improved. There is no  drainable fluid collection.  4. Stable sigmoid mesocolon and perirectal lymph nodes. Although  these are likely reactive and related to the chronic inflammation of  the colovaginal fistula, the possibility of a underlying colonic  neoplasm is difficult to exclude radiographically.  5. Interval resolution of right pyelonephritis compared to prior  imaging.  ASSESSMENT AND PLAN:  Brandy Vaughn is a 38 year old female who presents to the office after her second bout of diverticulitis. She  was recently hospitalized and given IV antibiotics. A CT scan did show evidence of  perforation but no abscess. I recommended that she continue her three-week course of Cipro and Flagyl. She will call the office if her symptoms worsen after coming off of antibiotics. We will refill these if this occurs. She saw Dr. Laural BenesJohnson to get a colonoscopy given her family history of colon cancer and CT findings.  No tumor was noted.   The surgery and anatomy were described to the patient as well as the risks of surgery and the possible complications.  These include: Bleeding, deep abdominal infections and possible wound complications such as hernia and infection, damage to adjacent structures, leak of surgical connections, which can lead to other surgeries and possibly an ostomy, possible need for other procedures, such as abscess drains in radiology, possible prolonged hospital stay, possible diarrhea from removal of part of the colon, possible constipation from narcotics, prolonged fatigue/weakness or appetite loss, possible early recurrence of of disease, possible complications of their medical problems such as heart disease or arrhythmias or lung problems, death (less than 1%). I believe the patient understands and wishes to proceed with the surgery.  Vanita PandaAlicia C Gwenyth Dingee, MD  Colon and Rectal Surgery / General Surgery  [facility]

## 2014-04-14 LAB — CBC
HEMATOCRIT: 33.6 % — AB (ref 36.0–46.0)
Hemoglobin: 11.3 g/dL — ABNORMAL LOW (ref 12.0–15.0)
MCH: 33.2 pg (ref 26.0–34.0)
MCHC: 33.6 g/dL (ref 30.0–36.0)
MCV: 98.8 fL (ref 78.0–100.0)
Platelets: 250 10*3/uL (ref 150–400)
RBC: 3.4 MIL/uL — AB (ref 3.87–5.11)
RDW: 12 % (ref 11.5–15.5)
WBC: 12.8 10*3/uL — AB (ref 4.0–10.5)

## 2014-04-14 LAB — BASIC METABOLIC PANEL
Anion gap: 12 (ref 5–15)
BUN: 7 mg/dL (ref 6–23)
CALCIUM: 8 mg/dL — AB (ref 8.4–10.5)
CO2: 23 mEq/L (ref 19–32)
Chloride: 104 mEq/L (ref 96–112)
Creatinine, Ser: 0.61 mg/dL (ref 0.50–1.10)
GFR calc Af Amer: >90 mL/min (ref >90)
GFR calc non Af Amer: >90 mL/min (ref >90)
GLUCOSE: 93 mg/dL (ref 70–99)
POTASSIUM: 3.5 meq/L — AB (ref 3.7–5.3)
SODIUM: 139 meq/L (ref 137–147)

## 2014-04-14 MED ORDER — MORPHINE SULFATE 2 MG/ML IJ SOLN
2.0000 mg | INTRAMUSCULAR | Status: DC | PRN
  Administered 2014-04-14 (×6): 4 mg via INTRAVENOUS
  Administered 2014-04-14: 2 mg via INTRAVENOUS
  Administered 2014-04-14 – 2014-04-15 (×7): 4 mg via INTRAVENOUS
  Administered 2014-04-16 (×3): 2 mg via INTRAVENOUS
  Administered 2014-04-16: 4 mg via INTRAVENOUS
  Administered 2014-04-17: 2 mg via INTRAVENOUS
  Filled 2014-04-14 (×3): qty 2
  Filled 2014-04-14: qty 1
  Filled 2014-04-14: qty 2
  Filled 2014-04-14: qty 1
  Filled 2014-04-14: qty 2
  Filled 2014-04-14: qty 1
  Filled 2014-04-14 (×6): qty 2
  Filled 2014-04-14 (×2): qty 1
  Filled 2014-04-14 (×3): qty 2

## 2014-04-14 NOTE — Progress Notes (Signed)
Patient ID: Brandy Vaughn, female   DOB: 07/31/1975, 38 y.o.   MRN: [id number] 1 Day Post-Op  Subjective: Complaining of a lot of abdominal pain, mostly on the right side, worse with any motion. Minimal nausea.  Objective: Vital signs in last 24 hours: Temp:  [97.4 F (36.3 C)-98.5 F (36.9 C)] 98.1 F (36.7 C) (10/17 0552) Pulse Rate:  [54-86] 54 (10/17 0552) Resp:  [9-18] 17 (10/17 0552) BP: (123-159)/(62-93) 131/67 mmHg (10/17 0552) SpO2:  [99 %-100 %] 100 % (10/17 0552) Weight:  [144 lb 13.5 oz (65.7 kg)] 144 lb 13.5 oz (65.7 kg) (10/16 1245) Last BM Date: 04/13/14  Intake/Output from previous day: 10/16 0701 - 10/17 0700 In: 4161.3 [I.V.:4161.3] Out: 1205 [Urine:1205] Intake/Output this shift:    General appearance: alert, cooperative and mild distress GI: nondistended. Very tender diffusely even with light touch mostly on the right side. Few bowel sounds. Incisions: Clean and dry  Lab Results:   Recent Labs  04/14/14 0530  WBC 12.8*  HGB 11.3*  HCT 33.6*  PLT 250   BMET  Recent Labs  04/14/14 0530  NA 139  K 3.5*  CL 104  CO2 23  GLUCOSE 93  BUN 7  CREATININE 0.61  CALCIUM 8.0*     Studies/Results: No results found.  Anti-infectives: Anti-infectives   Start     Dose/Rate Route Frequency Ordered Stop   04/13/14 2000  cefoTEtan (CEFOTAN) 2 g in dextrose 5 % 50 mL IVPB     2 g 100 mL/hr over 30 Minutes Intravenous Every 12 hours 04/13/14 1251 04/13/14 2112   04/13/14 0608  cefoTEtan (CEFOTAN) 2 g in dextrose 5 % 50 mL IVPB     2 g 100 mL/hr over 30 Minutes Intravenous On call to O.R. 04/13/14 0608 04/13/14 0730      Assessment/Plan: s/p Procedure(s): XI ROBOT ASSISTED LAPAROSCOPIC SIGMOID COLECTOMY AND LYSIS OF ADHESIONS WITH RIGID PROCTOSCOPY Having a lot of pain but I suspect this may be secondary to her chronic pain issues. She has not been on her regular hydrocodone dose. Will restart regular hydrocodone and increased frequency of her  morphine. Doubt any intra-abdominal problem.    LOS: 1 day    Brandy Vaughn T 04/14/2014

## 2014-04-14 NOTE — Anesthesia Postprocedure Evaluation (Signed)
  Anesthesia Post-op Note  Patient: Brandy Vaughn  Procedure(s) Performed: Procedure(s) (LRB): XI ROBOT ASSISTED LAPAROSCOPIC SIGMOID COLECTOMY AND LYSIS OF ADHESIONS WITH RIGID PROCTOSCOPY (N/A)  Patient Location: PACU  Anesthesia Type: General  Level of Consciousness: awake and alert   Airway and Oxygen Therapy: Patient Spontanous Breathing  Post-op Pain: mild  Post-op Assessment: Post-op Vital signs reviewed, Patient's Cardiovascular Status Stable, Respiratory Function Stable, Patent Airway and No signs of Nausea or vomiting  Last Vitals:  Filed Vitals:   04/14/14 1021  BP: 128/72  Pulse: 73  Temp: 36.7 C  Resp: 16    Post-op Vital Signs: stable   Complications: No apparent anesthesia complications

## 2014-04-15 LAB — CBC
HEMATOCRIT: 31.9 % — AB (ref 36.0–46.0)
HEMOGLOBIN: 10.7 g/dL — AB (ref 12.0–15.0)
MCH: 33.5 pg (ref 26.0–34.0)
MCHC: 33.5 g/dL (ref 30.0–36.0)
MCV: 100 fL (ref 78.0–100.0)
Platelets: 226 10*3/uL (ref 150–400)
RBC: 3.19 MIL/uL — ABNORMAL LOW (ref 3.87–5.11)
RDW: 12.4 % (ref 11.5–15.5)
WBC: 7.9 10*3/uL (ref 4.0–10.5)

## 2014-04-15 LAB — BASIC METABOLIC PANEL
Anion gap: 10 (ref 5–15)
BUN: 6 mg/dL (ref 6–23)
CHLORIDE: 108 meq/L (ref 96–112)
CO2: 23 meq/L (ref 19–32)
CREATININE: 0.58 mg/dL (ref 0.50–1.10)
Calcium: 7.9 mg/dL — ABNORMAL LOW (ref 8.4–10.5)
GFR calc Af Amer: >90 mL/min (ref >90)
GFR calc non Af Amer: >90 mL/min (ref >90)
Glucose, Bld: 93 mg/dL (ref 70–99)
Potassium: 3.6 mEq/L — ABNORMAL LOW (ref 3.7–5.3)
Sodium: 141 mEq/L (ref 137–147)

## 2014-04-15 MED ORDER — HYDROCODONE-ACETAMINOPHEN 10-325 MG PO TABS
1.0000 | ORAL_TABLET | ORAL | Status: DC | PRN
  Administered 2014-04-15 – 2014-04-17 (×11): 2 via ORAL
  Filled 2014-04-15 (×11): qty 2

## 2014-04-15 NOTE — Progress Notes (Signed)
Patient ID: Mammie R Wolfert, female   DOB: 10/13/1975, 38 y.o.   MRN: [id number] 2 Days Post-Op  Subjective: Feels much better today. Tolerating liquid diet without nausea and had 2 loose bowel movements. Just "sore"  Objective: Vital signs in last 24 hours: Temp:  [98 F (36.7 C)-98.6 F (37 C)] 98.2 F (36.8 C) (10/18 0625) Pulse Rate:  [60-78] 60 (10/18 0625) Resp:  [16] 16 (10/18 0625) BP: (105-128)/(60-73) 118/73 mmHg (10/18 0625) SpO2:  [100 %] 100 % (10/18 0625) Last BM Date: 04/14/14  Intake/Output from previous day: 10/17 0701 - 10/18 0700 In: 3853.8 [P.O.:2040; I.V.:1813.8] Out: 1425 [Urine:1425] Intake/Output this shift:    General appearance: alert, cooperative and no distress GI: incisional tenderness, nondistended Incision/Wound: clean and dry  Lab Results:   Recent Labs  04/14/14 0530 04/15/14 0612  WBC 12.8* 7.9  HGB 11.3* 10.7*  HCT 33.6* 31.9*  PLT 250 226   BMET  Recent Labs  04/14/14 0530 04/15/14 0612  NA 139 141  K 3.5* 3.6*  CL 104 108  CO2 23 23  GLUCOSE 93 93  BUN 7 6  CREATININE 0.61 0.58  CALCIUM 8.0* 7.9*     Studies/Results: No results found.  Anti-infectives: Anti-infectives   Start     Dose/Rate Route Frequency Ordered Stop   04/13/14 2000  cefoTEtan (CEFOTAN) 2 g in dextrose 5 % 50 mL IVPB     2 g 100 mL/hr over 30 Minutes Intravenous Every 12 hours 04/13/14 1251 04/13/14 2112   04/13/14 0608  cefoTEtan (CEFOTAN) 2 g in dextrose 5 % 50 mL IVPB     2 g 100 mL/hr over 30 Minutes Intravenous On call to O.R. 04/13/14 0608 04/13/14 0730      Assessment/Plan: s/p Procedure(s): XI ROBOT ASSISTED LAPAROSCOPIC SIGMOID COLECTOMY AND LYSIS OF ADHESIONS WITH RIGID PROCTOSCOPY Doing well without apparent complication. Full liquid diet. Increase activity.   LOS: 2 days    Chancelor Hardrick T 04/15/2014

## 2014-04-15 NOTE — Progress Notes (Signed)
Nutrition Brief Note  Patient identified on the Malnutrition Screening Tool (MST) Report  Wt Readings from Last 15 Encounters:  04/13/14 144 lb 13.5 oz (65.7 kg)  04/13/14 144 lb 13.5 oz (65.7 kg)  03/27/14 145 lb (65.772 kg)  03/01/14 146 lb (66.225 kg)  03/01/14 146 lb (66.225 kg)  01/23/14 146 lb 9.6 oz (66.497 kg)  01/04/14 151 lb (68.493 kg)  01/01/14 152 lb (68.947 kg)  12/23/13 150 lb (68.04 kg)  11/27/13 161 lb (73.029 kg)  07/11/13 163 lb (73.936 kg)    Body mass index is 23.39 kg/(m^2). Patient meets criteria for normal body weight based on current BMI.   Current diet order is full liquid, patient is consuming approximately 100% of meals at this time. Labs and medications reviewed.   Pt reports that she is tolerating full liquids and feels that she will transition well to a regular diet. She denied the need for nutritional supplements at this time.   No nutrition interventions warranted at this time. If nutrition issues arise, please consult RD.   Ebbie LatusHaley Hawkins RD, LDN

## 2014-04-16 LAB — BASIC METABOLIC PANEL
Anion gap: 10 (ref 5–15)
BUN: 4 mg/dL — ABNORMAL LOW (ref 6–23)
CALCIUM: 8.3 mg/dL — AB (ref 8.4–10.5)
CO2: 22 meq/L (ref 19–32)
Chloride: 111 mEq/L (ref 96–112)
Creatinine, Ser: 0.54 mg/dL (ref 0.50–1.10)
GFR calc Af Amer: >90 mL/min (ref >90)
GLUCOSE: 86 mg/dL (ref 70–99)
Potassium: 3.9 mEq/L (ref 3.7–5.3)
Sodium: 143 mEq/L (ref 137–147)

## 2014-04-16 LAB — CBC
HEMATOCRIT: 31.8 % — AB (ref 36.0–46.0)
Hemoglobin: 10.6 g/dL — ABNORMAL LOW (ref 12.0–15.0)
MCH: 32.9 pg (ref 26.0–34.0)
MCHC: 33.3 g/dL (ref 30.0–36.0)
MCV: 98.8 fL (ref 78.0–100.0)
PLATELETS: 241 10*3/uL (ref 150–400)
RBC: 3.22 MIL/uL — ABNORMAL LOW (ref 3.87–5.11)
RDW: 12.2 % (ref 11.5–15.5)
WBC: 7.4 10*3/uL (ref 4.0–10.5)

## 2014-04-16 MED ORDER — IBUPROFEN 200 MG PO TABS
400.0000 mg | ORAL_TABLET | Freq: Four times a day (QID) | ORAL | Status: DC | PRN
  Administered 2014-04-16 (×2): 800 mg via ORAL
  Administered 2014-04-16: 400 mg via ORAL
  Administered 2014-04-17: 800 mg via ORAL
  Filled 2014-04-16 (×4): qty 4
  Filled 2014-04-16 (×2): qty 2

## 2014-04-16 NOTE — Progress Notes (Signed)
Patient ID: Brandy Vaughn, female   DOB: 10/30/1975, 38 y.o.   MRN: [id number] 3 Days Post-Op  Subjective: Feels better today. Having some abd wall pain.  Tolerating liquid diet without nausea and having bowel movements. Just "sore"  Objective: Vital signs in last 24 hours: Temp:  [98 F (36.7 C)-98.3 F (36.8 C)] 98.1 F (36.7 C) (10/19 0500) Pulse Rate:  [52-60] 54 (10/19 0500) Resp:  [18] 18 (10/19 0500) BP: (122-141)/(70-90) 141/90 mmHg (10/19 0500) SpO2:  [100 %] 100 % (10/19 0500) Last BM Date: 04/15/14  Intake/Output from previous day: 10/18 0701 - 10/19 0700 In: 2760 [P.O.:960; I.V.:1800] Out: 600 [Urine:600] Intake/Output this shift:    General appearance: alert, cooperative and no distress GI: incisional tenderness, nondistended Incision/Wound: clean and dry  Lab Results:   Recent Labs  04/15/14 0612 04/16/14 0505  WBC 7.9 7.4  HGB 10.7* 10.6*  HCT 31.9* 31.8*  PLT 226 241   BMET  Recent Labs  04/15/14 0612 04/16/14 0505  NA 141 143  K 3.6* 3.9  CL 108 111  CO2 23 22  GLUCOSE 93 86  BUN 6 4*  CREATININE 0.58 0.54  CALCIUM 7.9* 8.3*     Studies/Results: No results found.  Anti-infectives: Anti-infectives   Start     Dose/Rate Route Frequency Ordered Stop   04/13/14 2000  cefoTEtan (CEFOTAN) 2 g in dextrose 5 % 50 mL IVPB     2 g 100 mL/hr over 30 Minutes Intravenous Every 12 hours 04/13/14 1251 04/13/14 2112   04/13/14 0608  cefoTEtan (CEFOTAN) 2 g in dextrose 5 % 50 mL IVPB     2 g 100 mL/hr over 30 Minutes Intravenous On call to O.R. 04/13/14 0608 04/13/14 0730      Assessment/Plan: s/p Procedure(s): XI ROBOT ASSISTED LAPAROSCOPIC SIGMOID COLECTOMY AND LYSIS OF ADHESIONS WITH RIGID PROCTOSCOPY Doing well without apparent complication. Soft diet. Increase activity. D/C when able to tolerate pain with PO meds   LOS: 3 days    Brandy Vaughn C. 04/16/2014

## 2014-04-17 MED ORDER — HYDROCODONE-ACETAMINOPHEN 10-325 MG PO TABS: 1.0000 | ORAL_TABLET | Freq: Four times a day (QID) | ORAL | Status: DC | PRN

## 2014-04-17 NOTE — Discharge Instructions (Signed)

## 2014-04-17 NOTE — Plan of Care (Signed)
Problem: Phase I Progression Outcomes Goal: Pain controlled with appropriate interventions Outcome: Completed/Met Date Met:  04/17/14 Patient able to take po medications to control pain

## 2014-04-17 NOTE — Discharge Summary (Signed)
Physician Discharge Summary  Patient ID: Brandy Vaughn MRN: [id number] DOB/AGE: 40/02/1976 40 y.o.  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 *   Discharged Condition: good  Hospital Course: Patient admitted after robotic assisted sigmoidectomy.  Her diet was advanced slowly.  Her foley was removed on POD 2.  She also had return of bowel function by POD 2.  She was discharged to home when tolerating a diet and her pain was controlled with PO medications.    Consults: None  Significant Diagnostic Studies: labs: cbc, chemistry  Treatments: IV hydration, analgesia: acetaminophen w/ codeine and PT consultation  Discharge Exam: Blood pressure 127/71, pulse 68, temperature 98 F (36.7 C), temperature source Oral, resp. rate 18, height 5\' 6"  (1.676 m), weight 144 lb 13.5 oz (65.7 kg), SpO2 100.00%. General appearance: alert and cooperative GI: normal findings: soft, non-tender Incision/Wound: clean, dry, intact  Disposition: 01-Home or Self Care     Medication List    ASK your doctor about these medications       amitriptyline 25 MG tablet  Commonly known as:  ELAVIL  Take 50 mg by mouth at bedtime.     cephALEXin 500 MG capsule  Commonly known as:  KEFLEX  Take 500 mg by mouth 2 (two) times daily.     ciprofloxacin 500 MG tablet  Commonly known as:  CIPRO  Take 500 mg by mouth 2 (two) times daily.     etonogestrel 68 MG Impl implant  Commonly known as:  NEXPLANON  Inject 1 each into the skin once.     HYDROcodone-acetaminophen 10-325 MG per tablet  Commonly known as:  NORCO  Take 2 tablets by mouth every 6 (six) hours.     ketoconazole 2 % cream  Commonly known as:  NIZORAL  Apply 1 application topically once as needed for irritation.     lisdexamfetamine 40 MG capsule  Commonly known as:  VYVANSE  Take 40 mg  by mouth daily as needed (focus).     methocarbamol 750 MG tablet  Commonly known as:  ROBAXIN  Take 750 mg by mouth 3 (three) times daily as needed (pain).     metroNIDAZOLE 500 MG tablet  Commonly known as:  FLAGYL  Take 500 mg by mouth 3 (three) times daily.     nicotine 21 mg/24hr patch  Commonly known as:  NICODERM CQ - dosed in mg/24 hours  Place 21 mg onto the skin daily.     saccharomyces boulardii 250 MG capsule  Commonly known as:  FLORASTOR  Take 250 mg by mouth every morning.     traMADol 50 MG tablet  Commonly known as:  ULTRAM  Take 50 mg by mouth every 6 (six) hours as needed for moderate pain.     valsartan-hydrochlorothiazide 160-25 MG per tablet  Commonly known as:  DIOVAN-HCT  Take 1 tablet by mouth every morning.           Follow-up Information   Follow up with Vanita PandaHOMAS, Brandy Norem C., MD. Schedule an appointment as soon as possible for a visit in 2 weeks.   Specialty:  General Surgery   Contact information:   [address] [place] [address] [phone]       Signed: Vanita PandaHOMAS, Brandy Mcnulty C. 04/17/2014, 8:25 AM

## 2014-04-17 NOTE — Progress Notes (Signed)
Pt given discharge instructions.  Discharged via wheelchair

## 2014-04-17 NOTE — Evaluation (Signed)
Physical Therapy Evaluation Patient Details Name: Brandy Vaughn MRN: [id number] DOB: 07/11/1975 Today's Date: 04/17/2014   History of Present Illness  admitted with abdominal pain and diverticulitis, now s/p robotic assisted sigmoidectomy.   Clinical Impression  Pt with difficulty with performing bed mobility with prior back issues and new abdominal incisions and pain. Educated and performed bed mobility today at modified independent level in order for pt to return home and manage things during the day by herself. Also educated on sitting positions and sit to stand from lower surfaces. Pt perfromed and no further PT needed at this time. Thanks     Follow Up Recommendations No PT follow up    Equipment Recommendations   (pt to use her mother's equipment)    Recommendations for Other Services       Precautions / Restrictions        Mobility  Bed Mobility Overal bed mobility: Modified Independent             General bed mobility comments: worked on different ways pt could get OOB with abdominal paina dn LB pain. Worked on hooklying with log roll and pillow between legs, then slowly work up with UEs. Pt performed 2xs.   Transfers Overall transfer level: Modified independent Equipment used: None             General transfer comment: worked on getting up from low surface like toilet at home with no grab bar and walking hands up legs to help with rise . Able to perform 2xs. Will use RW at home and discussed that ability for support to rise off toilet.   Ambulation/Gait Ambulation/Gait assistance: Modified independent (Device/Increase time) Ambulation Distance (Feet):  (per nursing and pt,  pt has walked in hall and room slowly and guarded , the focus of our session was bed mobilty and sit to stand off low surfaces. ) Assistive device: None (pt will use RW initially at home for support on abdominal area. )   Gait velocity: slow    General Gait Details: steady  Careers information officertairs            Wheelchair Mobility    Modified Rankin (Stroke Patients Only)       Balance Overall balance assessment: Independent                                           Pertinent Vitals/Pain Pain Assessment: 0-10 Pain Score: 6  Pain Location: abdominal area where incision is especially with movement Pain Descriptors / Indicators: Aching;Burning Pain Intervention(s): Premedicated before session;Monitored during session;Limited activity within patient's tolerance    Home Living Family/patient expects to be discharged to:: Private residence Living Arrangements: Children (young 4811 and 40 yo daughters) Available Help at Discharge: Family (her boyfriend may be able to help some at night but works day.) Type of Home: House Home Access: Stairs to enter   Entergy CorporationEntrance Stairs-Number of Steps: few, pt not worried once up on her feet Home Layout: One level;Able to live on main level with bedroom/bathroom Home Equipment: Shower seat - built in;Walker - 2 wheels (will borrow her mom's RW for stability first few days)      Prior Function Level of Independence: Independent               Hand Dominance        Extremity/Trunk Assessment  Lower Extremity Assessment: Overall WFL for tasks assessed         Communication   Communication: No difficulties  Cognition Arousal/Alertness: Awake/alert Behavior During Therapy: WFL for tasks assessed/performed Overall Cognitive Status: Within Functional Limits for tasks assessed                      General Comments      Exercises        Assessment/Plan    PT Assessment Patent does not need any further PT services  PT Diagnosis Other (comment) (difficulty with mobility adn bed mobility)   PT Problem List    PT Treatment Interventions     PT Goals (Current goals can be found in the Care Plan section) Acute Rehab PT Goals Patient Stated Goal: she want to be able to move around  the home independently during day becuase she will be by herself. PT Goal Formulation: All assessment and education complete, DC therapy Potential to Achieve Goals: Good    Frequency     Barriers to discharge        Co-evaluation               End of Session   Activity Tolerance: Patient tolerated treatment well Patient left: in chair (educated on how to sit in chari with pillow supporting back ) Nurse Communication: Mobility status         Time: 1030-1100 PT Time Calculation (min): 30 min   Charges:   PT Evaluation $Initial PT Evaluation Tier I: 1 Procedure PT Treatments $Therapeutic Activity: 8-22 mins   PT G CodesMarella Bile:          Teddy Pena 04/17/2014, 11:09 AM Marella BileSharron Hayde Kilgour, PT Pager: [phone] 04/17/2014

## 2014-04-18 ENCOUNTER — Telehealth (INDEPENDENT_AMBULATORY_CARE_PROVIDER_SITE_OTHER): Payer: Self-pay | Admitting: General Surgery

## 2014-04-18 NOTE — Telephone Encounter (Signed)
I will let her know.  Thank you!

## 2014-04-18 NOTE — Telephone Encounter (Signed)
She should not have any more chronic pain since her colon is now out.  This was the same pain medication that was controlling her pain when she was in the hospital.  She needs to take ibuprofen 400mg  q6h for the next few days with her Norco.

## 2014-04-18 NOTE — Telephone Encounter (Signed)
Patient called in epxlaining that she had surgery yesterday for a robotic assisted lap sig colectomy and the pain medication she was discharged for is the same as the one she takes daily for her chronic pain.  She informed me that this medication is keep her chronic pain at bay but is not helping with the abdominal discomfort.  She would like to see if there is anything we can rpescribe her for breathrough pain.  Informed her that I would send this message to Dr. Maisie Fushomas and that I would get back in touch her with as soon as I recieve a response.  Patient verbalized understanding of this.

## 2014-05-11 ENCOUNTER — Other Ambulatory Visit (HOSPITAL_COMMUNITY): Payer: Self-pay | Admitting: Family

## 2014-05-11 ENCOUNTER — Ambulatory Visit (HOSPITAL_COMMUNITY)
Admission: RE | Admit: 2014-05-11 | Discharge: 2014-05-11 | Disposition: A | Discharge status: Home / Self Care | Payer: Medicaid Other | Source: Ambulatory Visit | Attending: Family | Admitting: Family

## 2014-05-11 DIAGNOSIS — R1032 Left lower quadrant pain: Secondary | ICD-10-CM

## 2014-05-16 ENCOUNTER — Other Ambulatory Visit (INDEPENDENT_AMBULATORY_CARE_PROVIDER_SITE_OTHER): Payer: Self-pay

## 2014-05-16 DIAGNOSIS — R1032 Left lower quadrant pain: Secondary | ICD-10-CM

## 2014-05-18 ENCOUNTER — Ambulatory Visit
Admission: RE | Admit: 2014-05-18 | Discharge: 2014-05-18 | Disposition: A | Discharge status: Home / Self Care | Payer: Medicaid Other | Source: Ambulatory Visit | Attending: General Surgery | Admitting: General Surgery

## 2014-05-18 DIAGNOSIS — R1032 Left lower quadrant pain: Secondary | ICD-10-CM

## 2014-05-18 MED ORDER — IOHEXOL 300 MG/ML  SOLN
100.0000 mL | Freq: Once | INTRAMUSCULAR | Status: AC | PRN
  Administered 2014-05-18: 100 mL via INTRAVENOUS

## 2015-05-29 ENCOUNTER — Emergency Department (HOSPITAL_COMMUNITY)
Admission: EM | Admit: 2015-05-29 | Discharge: 2015-05-29 | Disposition: A | Discharge status: Home / Self Care | Payer: Medicaid Other | Attending: Emergency Medicine | Admitting: Emergency Medicine

## 2015-05-29 ENCOUNTER — Encounter (HOSPITAL_COMMUNITY): Payer: Self-pay

## 2015-05-29 DIAGNOSIS — Z8719 Personal history of other diseases of the digestive system: Secondary | ICD-10-CM | POA: Diagnosis not present

## 2015-05-29 DIAGNOSIS — F17210 Nicotine dependence, cigarettes, uncomplicated: Secondary | ICD-10-CM | POA: Insufficient documentation

## 2015-05-29 DIAGNOSIS — Z872 Personal history of diseases of the skin and subcutaneous tissue: Secondary | ICD-10-CM | POA: Insufficient documentation

## 2015-05-29 DIAGNOSIS — I10 Essential (primary) hypertension: Secondary | ICD-10-CM | POA: Insufficient documentation

## 2015-05-29 DIAGNOSIS — R197 Diarrhea, unspecified: Secondary | ICD-10-CM | POA: Insufficient documentation

## 2015-05-29 DIAGNOSIS — G8929 Other chronic pain: Secondary | ICD-10-CM | POA: Insufficient documentation

## 2015-05-29 DIAGNOSIS — R112 Nausea with vomiting, unspecified: Secondary | ICD-10-CM | POA: Insufficient documentation

## 2015-05-29 DIAGNOSIS — Z3202 Encounter for pregnancy test, result negative: Secondary | ICD-10-CM | POA: Diagnosis not present

## 2015-05-29 DIAGNOSIS — R1032 Left lower quadrant pain: Secondary | ICD-10-CM | POA: Diagnosis present

## 2015-05-29 DIAGNOSIS — Z8659 Personal history of other mental and behavioral disorders: Secondary | ICD-10-CM | POA: Insufficient documentation

## 2015-05-29 LAB — URINALYSIS, ROUTINE W REFLEX MICROSCOPIC
Bilirubin Urine: NEGATIVE
GLUCOSE, UA: NEGATIVE mg/dL
KETONES UR: NEGATIVE mg/dL
LEUKOCYTES UA: NEGATIVE
Nitrite: NEGATIVE
Protein, ur: NEGATIVE mg/dL
SPECIFIC GRAVITY, URINE: 1.006 (ref 1.005–1.030)
pH: 6 (ref 5.0–8.0)

## 2015-05-29 LAB — COMPREHENSIVE METABOLIC PANEL
ALBUMIN: 4.5 g/dL (ref 3.5–5.0)
ALT: 18 U/L (ref 14–54)
AST: 15 U/L (ref 15–41)
Alkaline Phosphatase: 55 U/L (ref 38–126)
Anion gap: 6 (ref 5–15)
BUN: 10 mg/dL (ref 6–20)
CO2: 25 mmol/L (ref 22–32)
Calcium: 9.2 mg/dL (ref 8.9–10.3)
Chloride: 105 mmol/L (ref 101–111)
Creatinine, Ser: 0.65 mg/dL (ref 0.44–1.00)
GFR calc Af Amer: >60 mL/min (ref >60)
GFR calc non Af Amer: >60 mL/min (ref >60)
GLUCOSE: 100 mg/dL — AB (ref 65–99)
POTASSIUM: 4.1 mmol/L (ref 3.5–5.1)
Sodium: 136 mmol/L (ref 135–145)
Total Bilirubin: 0.5 mg/dL (ref 0.3–1.2)
Total Protein: 7.6 g/dL (ref 6.5–8.1)

## 2015-05-29 LAB — CBC
HCT: 41.7 % (ref 36.0–46.0)
HEMOGLOBIN: 14 g/dL (ref 12.0–15.0)
MCH: 32.4 pg (ref 26.0–34.0)
MCHC: 33.6 g/dL (ref 30.0–36.0)
MCV: 96.5 fL (ref 78.0–100.0)
Platelets: 266 10*3/uL (ref 150–400)
RBC: 4.32 MIL/uL (ref 3.87–5.11)
RDW: 11.8 % (ref 11.5–15.5)
WBC: 10.8 10*3/uL — ABNORMAL HIGH (ref 4.0–10.5)

## 2015-05-29 LAB — URINE MICROSCOPIC-ADD ON

## 2015-05-29 LAB — LIPASE, BLOOD: LIPASE: 30 U/L (ref 11–51)

## 2015-05-29 LAB — PREGNANCY, URINE: Preg Test, Ur: NEGATIVE

## 2015-05-29 MED ORDER — METRONIDAZOLE 500 MG PO TABS
500.0000 mg | ORAL_TABLET | Freq: Once | ORAL | Status: AC
  Administered 2015-05-29: 500 mg via ORAL
  Filled 2015-05-29: qty 1

## 2015-05-29 MED ORDER — CIPROFLOXACIN HCL 500 MG PO TABS: 500.0000 mg | ORAL_TABLET | Freq: Two times a day (BID) | ORAL | Status: DC

## 2015-05-29 MED ORDER — METRONIDAZOLE 500 MG PO TABS: 500.0000 mg | ORAL_TABLET | Freq: Three times a day (TID) | ORAL | Status: DC

## 2015-05-29 MED ORDER — CIPROFLOXACIN IN D5W 400 MG/200ML IV SOLN
400.0000 mg | Freq: Once | INTRAVENOUS | Status: AC
  Administered 2015-05-29: 400 mg via INTRAVENOUS
  Filled 2015-05-29: qty 200

## 2015-05-29 MED ORDER — MORPHINE SULFATE (PF) 4 MG/ML IV SOLN
4.0000 mg | Freq: Once | INTRAVENOUS | Status: AC
  Administered 2015-05-29: 4 mg via INTRAVENOUS
  Filled 2015-05-29: qty 1

## 2015-05-29 MED ORDER — ONDANSETRON HCL 4 MG/2ML IJ SOLN
4.0000 mg | Freq: Once | INTRAMUSCULAR | Status: AC
  Administered 2015-05-29: 4 mg via INTRAVENOUS
  Filled 2015-05-29: qty 2

## 2015-05-29 MED ORDER — OXYCODONE-ACETAMINOPHEN 5-325 MG PO TABS
2.0000 | ORAL_TABLET | Freq: Once | ORAL | Status: AC
Start: 2015-05-29 — End: 2015-05-29
  Administered 2015-05-29: 2 via ORAL
  Filled 2015-05-29: qty 2

## 2015-05-29 MED ORDER — HYDROCODONE-ACETAMINOPHEN 5-325 MG PO TABS: 1.0000 | ORAL_TABLET | Freq: Four times a day (QID) | ORAL | Status: DC | PRN

## 2015-05-29 MED ORDER — ONDANSETRON HCL 4 MG PO TABS: 4.0000 mg | ORAL_TABLET | Freq: Four times a day (QID) | ORAL | Status: DC

## 2015-05-29 MED ORDER — SODIUM CHLORIDE 0.9 % IV BOLUS (SEPSIS)
1000.0000 mL | Freq: Once | INTRAVENOUS | Status: AC
  Administered 2015-05-29: 1000 mL via INTRAVENOUS

## 2015-05-29 NOTE — ED Provider Notes (Signed)
Patient seen/examined in the Emergency Department in conjunction with Midlevel Provider  Patient reports LLQ pain, similar to prior diverticulitis Exam : awake/alert, LLQ tenderness but no rebound or guarding Plan: will start IV antibiotics.  Due to multiple CT scans in the past, advised we could defer imaging and empirically treat at this time    Brandy Rhineonald Allen Basista, MD 05/29/15 2011

## 2015-05-29 NOTE — ED Provider Notes (Signed)
CSN: [id number]     Arrival date & time 05/29/15  1245 History   First MD Initiated Contact with Patient 05/29/15 1759     Chief Complaint  Patient presents with  . Abdominal Pain  . Flank Pain  . 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 The pain has been there for the past few days. She describes it as a throbbing pain that is gradually worsening. Palpation exacerbates the pain. She has not found any alleviating factors. Associated symptoms include nausea, vomiting and diarrhea. Denies blood in her vomit or stool. She is concerned that she is having a recurrence of diverticulitis. She states that these are the exact same symptoms that she had at her last presentation. 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 She states that her episodes always present this way. She follows with a gastroenterologist. She had been released from the care of her colorectal surgeon over 6 months ago. She has no other complaints at this time.  Past Medical History  Diagnosis Date  . Hypertension   . ADD (attention deficit disorder)   . Rosacea   . Vaginal tear resulting from childbirth     3rd degree  . Chronic back pain     on chronic narcotics  . Diverticulitis   . 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 Panic attacks    Past Surgical History  Procedure Laterality Date  . Wisdom tooth extraction    . Colonoscopy with propofol N/A 03/01/2014    Procedure: COLONOSCOPY WITH PROPOFOL;  Surgeon: Charolett BumpersMartin K Johnson, MD;  Location: [facility];  Service: Endoscopy;  Laterality: N/A;   Family History  Problem Relation Age of Onset  . Cancer Mother     Colon, Cervical   . Hypertension Father   . Osteoporosis Paternal Grandmother   . Hypertension Paternal  Grandmother   . Cancer Paternal Grandfather     Bladder, Throat    Social History  Substance Use Topics  . Smoking status: Current Every Day Smoker -- 0.50 packs/day for 0 years    Types: Cigarettes  . Smokeless tobacco: Never Used     Comment: She is trying E-Cigarrettes to quit smoking   . Alcohol Use: No   OB History    No data available     Review of Systems  Gastrointestinal: Positive for nausea, vomiting, abdominal pain and diarrhea.  All other systems reviewed and are negative.     Allergies  Review of patient's allergies indicates no known allergies.  Home Medications   Prior to Admission medications   Medication Sig Start Date End Date Taking? Authorizing Provider  HYDROcodone-acetaminophen (NORCO) 10-325 MG per tablet Take 1-2 tablets by mouth every 6 (six) hours as needed for moderate pain. 04/17/14  Yes Nonie HoyerMegan N Baird, PA-C  ondansetron (ZOFRAN) 4 MG tablet Take 4 mg by mouth every 8 (eight) hours as needed for nausea or vomiting.   Yes Historical Provider, MD  ciprofloxacin (CIPRO) 500 MG tablet Take 1 tablet (500 mg total) by mouth 2 (two) times daily. 05/29/15   Daney Moor, PA-C  HYDROcodone-acetaminophen (NORCO/VICODIN) 5-325 MG tablet Take 1-2 tablets by mouth every 6 (six) hours as needed. 05/29/15   Alias Villagran, PA-C  metroNIDAZOLE (FLAGYL) 500 MG tablet  Take 1 tablet (500 mg total) by mouth 3 (three) times daily. 05/29/15   Farida Mcreynolds, PA-C  ondansetron (ZOFRAN) 4 MG tablet Take 1 tablet (4 mg total) by mouth every 6 (six) hours. 05/29/15   Jose Corvin, PA-C   BP 156/95 mmHg  Pulse 71  Temp(Src) 98.2 F (36.8 C) (Oral)  Resp 18  SpO2 100% Physical Exam  Constitutional: She appears well-developed and well-nourished. No distress.  Nonseptic appearing  HENT:  Head: Normocephalic and atraumatic.  Eyes: Conjunctivae are normal. Right eye exhibits no discharge. Left eye exhibits no discharge. No scleral icterus.  Neck: Normal range of motion.   Cardiovascular: Normal rate and regular rhythm.   Pulmonary/Chest: Effort normal. No respiratory distress.  Abdominal: Soft. She exhibits no distension. There is tenderness in the left lower quadrant. There is no rebound and no guarding.  Left lower quadrant tenderness. No rebound, guarding or rigidity.  Musculoskeletal: Normal range of motion.  Moves all extremities spontaneously  Neurological: She is alert. Coordination normal.  Skin: Skin is warm and dry.  Psychiatric: She has a normal mood and affect. Her behavior is normal.  Nursing note and vitals reviewed.   ED Course  Procedures (including critical care time) Labs Review Labs Reviewed  COMPREHENSIVE METABOLIC PANEL - Abnormal; Notable for the following:    Glucose, Bld 100 (*)    All other components within normal limits  CBC - Abnormal; Notable for the following:    WBC 10.8 (*)    All other components within normal limits  URINALYSIS, ROUTINE W REFLEX MICROSCOPIC (NOT AT [facility]) - Abnormal; Notable for the following:    APPearance CLOUDY (*)    Hgb urine dipstick LARGE (*)    All other components within normal limits  URINE MICROSCOPIC-ADD ON - Abnormal; Notable for the following:    Squamous Epithelial / LPF 0-5 (*)    Bacteria, UA RARE (*)    All other components within normal limits  LIPASE, BLOOD  PREGNANCY, URINE  I-STAT BETA HCG BLOOD, ED ([facility], [facility], [facility] ONLY)    Imaging Review No results found. I have personally reviewed and evaluated these images and lab results as part of my medical decision-making.   EKG Interpretation None      MDM   Final diagnoses:  Left lower quadrant pain   39 year old female with history of diverticulitis presenting with left lower quadrant pain, nausea, vomiting and diarrhea. She states this is similar presentation to past episodes of diverticulitis. Vital signs stable. Patient is nontoxic, nonseptic appearing. Left lower quadrant pain to palpation without rebound or guarding.  White blood cell count slightly elevated to 10.8. Remaining lab work unremarkable. Patient has had multiple CT scans of her abdomen in the past 2 years for similar presentations. Discussed empirically treating pt for presumed diverticulitis without imaging. Pt and her husband agree with this plan. Given 1 dose IV abx in ED and discharged with PO abx. Pain and nausea controlled in ED. Pt seen and assessed by Dr. Bebe Shaggy who agrees with this plan. Strict return precautions given in discharge paperwork. Pt is to follow up with her GI doctor. Pt is stable for discharge.     Alveta Heimlich, PA-C 05/29/15 2351  Zadie Rhine, MD 05/30/15 [phone]

## 2015-05-29 NOTE — ED Notes (Signed)
Pt c/o intermittent LLQ abdominal pain, R flank pain, and n/v/d x 1.5 months.  Pain score 6/10.  Pt reports taking Zofran w/o relief.  Sts Hx of bowel resection x 1 year ago.  Hx of diverticulitis.

## 2015-05-29 NOTE — Discharge Instructions (Signed)
Schedule a follow up appointment with your GI doctor. Return to ED with worsening abdominal pain, fevers, uncontrollable vomiting, black or tarry stools or any other new or concerning symptoms.   Diverticulitis  Diverticulitis is inflammation or infection of small pouches in your colon that form when you have a condition called diverticulosis. The pouches in your colon are called diverticula. Your colon, or large intestine, is where water is absorbed and stool is formed.  Complications of diverticulitis can include:  Bleeding.  Severe infection.  Severe pain.  Perforation of your colon.    Obstruction of your colon. CAUSES  Diverticulitis is caused by bacteria.  Diverticulitis happens when stool becomes trapped in diverticula. This allows bacteria to grow in the diverticula, which can lead to inflammation and infection.  RISK FACTORS  People with diverticulosis are at risk for diverticulitis. Eating a diet that does not include enough fiber from fruits and vegetables may make diverticulitis more likely to develop.  SYMPTOMS  Symptoms of diverticulitis may include:  Abdominal pain and tenderness. The pain is normally located on the left side of the abdomen, but may occur in other areas.  Fever and chills.  Bloating.  Cramping.  Nausea.  Vomiting.  Constipation.  Diarrhea.  Blood in your stool. DIAGNOSIS  Your health care provider will ask you about your medical history and do a physical exam. You may need to have tests done because many medical conditions can cause the same symptoms as diverticulitis. Tests may include:  Blood tests.  Urine tests.  Imaging tests of the abdomen, including X-rays and CT scans. When your condition is under control, your health care provider may recommend that you have a colonoscopy. A colonoscopy can show how severe your diverticula are and whether something else is causing your symptoms.  TREATMENT  Most cases of diverticulitis are mild and can be  treated at home. Treatment may include:  Taking over-the-counter pain medicines.  Following a clear liquid diet.  Taking antibiotic medicines by mouth for 7-10 days. More severe cases may be treated at a hospital. Treatment may include:  Not eating or drinking.  Taking prescription pain medicine.  Receiving antibiotic medicines through an IV tube.  Receiving fluids and nutrition through an IV tube.  Surgery. HOME CARE INSTRUCTIONS  Follow your health care provider's instructions carefully.  Follow a full liquid diet or other diet as directed by your health care provider. After your symptoms improve, your health care provider may tell you to change your diet. He or she may recommend you eat a high-fiber diet. Fruits and vegetables are good sources of fiber. Fiber makes it easier to pass stool.  Take fiber supplements or probiotics as directed by your health care provider.  Only take medicines as directed by your health care provider.  Keep all your follow-up appointments. SEEK MEDICAL CARE IF:  Your pain does not improve.  You have a hard time eating food.  Your bowel movements do not return to normal. SEEK IMMEDIATE MEDICAL CARE IF:  Your pain becomes worse.  Your symptoms do not get better.  Your symptoms suddenly get worse.  You have a fever.  You have repeated vomiting.  You have bloody or black, tarry stools. MAKE SURE YOU:  Understand these instructions.  Will watch your condition.  Will get help right away if you are not doing well or get worse. This information is not intended to replace advice given to you by your health care provider. Make sure you discuss any  questions you have with your health care provider.  Document Released: 03/25/2005 Document Revised: 06/20/2013 Document Reviewed: 05/10/2013  Elsevier Interactive Patient Education [employer]2016 Elsevier Inc.

## 2015-06-05 ENCOUNTER — Other Ambulatory Visit: Payer: Self-pay | Admitting: Gastroenterology

## 2015-06-07 ENCOUNTER — Other Ambulatory Visit: Payer: Self-pay | Admitting: Gastroenterology

## 2015-07-23 ENCOUNTER — Ambulatory Visit (HOSPITAL_COMMUNITY): Admission: RE | Admit: 2015-07-23 | Payer: Medicaid Other | Source: Ambulatory Visit | Admitting: Gastroenterology

## 2015-07-23 ENCOUNTER — Encounter (HOSPITAL_COMMUNITY): Admission: RE | Payer: Self-pay | Source: Ambulatory Visit

## 2015-07-23 SURGERY — COLONOSCOPY WITH PROPOFOL
Anesthesia: Monitor Anesthesia Care

## 2015-08-29 ENCOUNTER — Other Ambulatory Visit: Payer: Self-pay | Admitting: Gastroenterology

## 2015-08-30 ENCOUNTER — Encounter (HOSPITAL_COMMUNITY): Payer: Self-pay | Admitting: *Deleted

## 2015-09-02 ENCOUNTER — Ambulatory Visit (HOSPITAL_COMMUNITY): Payer: Medicaid Other | Admitting: Anesthesiology

## 2015-09-02 ENCOUNTER — Ambulatory Visit (HOSPITAL_COMMUNITY)
Admission: RE | Admit: 2015-09-02 | Discharge: 2015-09-02 | Disposition: A | Discharge status: Home / Self Care | Payer: Medicaid Other | Source: Ambulatory Visit | Attending: Gastroenterology | Admitting: Gastroenterology

## 2015-09-02 ENCOUNTER — Encounter (HOSPITAL_COMMUNITY)
Admission: RE | Disposition: A | Discharge status: Home / Self Care | Payer: Self-pay | Source: Ambulatory Visit | Attending: Gastroenterology

## 2015-09-02 ENCOUNTER — Encounter (HOSPITAL_COMMUNITY): Payer: Self-pay | Admitting: *Deleted

## 2015-09-02 DIAGNOSIS — Z1211 Encounter for screening for malignant neoplasm of colon: Secondary | ICD-10-CM | POA: Insufficient documentation

## 2015-09-02 DIAGNOSIS — F17210 Nicotine dependence, cigarettes, uncomplicated: Secondary | ICD-10-CM | POA: Insufficient documentation

## 2015-09-02 DIAGNOSIS — Z9049 Acquired absence of other specified parts of digestive tract: Secondary | ICD-10-CM | POA: Diagnosis not present

## 2015-09-02 DIAGNOSIS — I10 Essential (primary) hypertension: Secondary | ICD-10-CM | POA: Diagnosis not present

## 2015-09-02 DIAGNOSIS — Z800 Family history of malignant neoplasm of digestive organs: Secondary | ICD-10-CM | POA: Insufficient documentation

## 2015-09-02 HISTORY — DX: Myoneural disorder, unspecified: G70.9

## 2015-09-02 HISTORY — PX: COLONOSCOPY WITH PROPOFOL: SHX5780

## 2015-09-02 SURGERY — COLONOSCOPY WITH PROPOFOL
Anesthesia: Monitor Anesthesia Care

## 2015-09-02 MED ORDER — SODIUM CHLORIDE 0.9 % IV SOLN: INTRAVENOUS | Status: DC

## 2015-09-02 MED ORDER — PROPOFOL 500 MG/50ML IV EMUL
INTRAVENOUS | Status: DC | PRN
  Administered 2015-09-02: 30 mg via INTRAVENOUS
  Administered 2015-09-02: 50 mg via INTRAVENOUS

## 2015-09-02 MED ORDER — PROPOFOL 500 MG/50ML IV EMUL
INTRAVENOUS | Status: DC | PRN
  Administered 2015-09-02: 140 ug/kg/min via INTRAVENOUS

## 2015-09-02 MED ORDER — PROPOFOL 10 MG/ML IV BOLUS
INTRAVENOUS | Status: AC
  Filled 2015-09-02: qty 20

## 2015-09-02 MED ORDER — GLYCOPYRROLATE 0.2 MG/ML IJ SOLN
INTRAMUSCULAR | Status: DC | PRN
  Administered 2015-09-02: 0.2 mg via INTRAVENOUS

## 2015-09-02 MED ORDER — LACTATED RINGERS IV SOLN
INTRAVENOUS | Status: DC
  Administered 2015-09-02: 10:00:00 via INTRAVENOUS
  Administered 2015-09-02: 1000 mL via INTRAVENOUS

## 2015-09-02 MED ORDER — ONDANSETRON HCL 4 MG/2ML IJ SOLN
INTRAMUSCULAR | Status: DC | PRN
  Administered 2015-09-02: 4 mg via INTRAVENOUS

## 2015-09-02 MED ORDER — MIDAZOLAM HCL 5 MG/5ML IJ SOLN
INTRAMUSCULAR | Status: DC | PRN
  Administered 2015-09-02: 2 mg via INTRAVENOUS

## 2015-09-02 MED ORDER — MIDAZOLAM HCL 2 MG/2ML IJ SOLN
INTRAMUSCULAR | Status: AC
  Filled 2015-09-02: qty 2

## 2015-09-02 SURGICAL SUPPLY — 22 items

## 2015-09-02 NOTE — Op Note (Signed)
Procedure: Baseline screening colonoscopy. Mother died of colon cancer at age 42. 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  Endoscopist: Danise EdgeMartin Johnson  Premedication: Propofol administered by anesthesia  Procedure: The patient was placed in the left lateral decubitus position. Anal inspection and digital rectal exam were normal. The Pentax pediatric colonoscope was introduced into the rectum and easily advanced to the cecum. A normal-appearing ileocecal valve and appendiceal orifice were identified. Colonic preparation for the exam today was good. Withdrawal time was 10 minutes  Rectum. Normal. Retroflex view of the distal rectum was normal  Sigmoid colon and descending colon. 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 Normal appearing surgical anastomosis was identified  Splenic flexure. Normal  Transverse colon. Normal  Hepatic flexure. Normal  Ascending colon. Normal  Cecum and ileocecal valve. Normal  Assessment: Normal screening colonoscopy post robotic-assisted sigmoid colectomy  Recommendation: Schedule repeat screening colonoscopy in 5 years

## 2015-09-02 NOTE — Anesthesia Preprocedure Evaluation (Signed)
Anesthesia Evaluation  Patient identified by MRN, date of birth, ID band Patient awake    Reviewed: Allergy & Precautions, NPO status , Patient's Chart, lab work & pertinent test results  Airway Mallampati: II  TM Distance: >3 FB Neck ROM: Full    Dental   Pulmonary Current Smoker,    breath sounds clear to auscultation       Cardiovascular hypertension,  Rhythm:Regular Rate:Normal     Neuro/Psych Anxiety negative neurological ROS     GI/Hepatic negative GI ROS, Neg liver ROS,   Endo/Other  negative endocrine ROS  Renal/GU negative Renal ROS     Musculoskeletal   Abdominal   Peds  Hematology negative hematology ROS (+)   Anesthesia Other Findings   Reproductive/Obstetrics                             Anesthesia Physical Anesthesia Plan  ASA: II  Anesthesia Plan: MAC   Post-op Pain Management:    Induction: Intravenous  Airway Management Planned: Natural Airway and Simple Face Mask  Additional Equipment:   Intra-op Plan:   Post-operative Plan:   Informed Consent: I have reviewed the patients History and Physical, chart, labs and discussed the procedure including the risks, benefits and alternatives for the proposed anesthesia with the patient or authorized representative who has indicated his/her understanding and acceptance.     Plan Discussed with: CRNA  Anesthesia Plan Comments:         Anesthesia Quick Evaluation

## 2015-09-02 NOTE — Anesthesia Postprocedure Evaluation (Signed)
Anesthesia Post Note  Patient: Brandy Vaughn  Procedure(s) Performed: Procedure(s) (LRB): COLONOSCOPY WITH PROPOFOL (N/A)  Patient location during evaluation: PACU Anesthesia Type: MAC Level of consciousness: awake and alert Pain management: pain level controlled Vital Signs Assessment: post-procedure vital signs reviewed and stable Respiratory status: spontaneous breathing, nonlabored ventilation, respiratory function stable and patient connected to nasal cannula oxygen Cardiovascular status: stable and blood pressure returned to baseline Anesthetic complications: no    Last Vitals:  Filed Vitals:   09/02/15 1016 09/02/15 1017  BP:  126/57  Pulse: 69   Temp: 37 C   Resp: 16     Last Pain: There were no vitals filed for this visit.               Kennieth RadFitzgerald, [name] E

## 2015-09-02 NOTE — Transfer of Care (Signed)
Immediate Anesthesia Transfer of Care Note  Patient: Brandy Vaughn  Procedure(s) Performed: Procedure(s): COLONOSCOPY WITH PROPOFOL (N/A)  Patient Location: PACU  Anesthesia Type:MAC  Level of Consciousness:  sedated, patient cooperative and responds to stimulation  Airway & Oxygen Therapy:Patient Spontanous Breathing and Patient connected to face mask oxgen  Post-op Assessment:  Report given to PACU RN and Post -op Vital signs reviewed and stable  Post vital signs:  Reviewed and stable  Last Vitals:  Filed Vitals:   09/02/15 1016 09/02/15 1017  BP:  126/57  Pulse: 69   Temp: 37 C   Resp: 16     Complications: No apparent anesthesia complications

## 2015-09-02 NOTE — H&P (Signed)
  Procedure: Screening colonoscopy. Mother died of colon cancer at age 42. 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 In 04/13/2014 robotic-assisted sigmoid colectomy performed  History: The patient is a 40 year old female born 04/15/1976. She has been experiencing left lower quadrant abdominal pain and was evaluated in the emergency room on 05/29/2015. She is scheduled to undergo her first screening colonoscopy with polypectomy to prevent colon cancer. In January 2017, she was prescribed antibiotic therapy to treat suspected acute recurrent diverticulitis.  Past medical history: Hypertension. Diverticular colonic disease. 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 Hypertension  Medication allergies: None  Exam: The patient is alert and lying comfortably on the endoscopy stretcher. Abdomen is soft and nontender to palpation. Lungs are clear to auscultation. Cardiac exam reveals a regular rhythm.  Plan: Proceed with screening colonoscopy

## 2015-09-02 NOTE — Discharge Instructions (Signed)
Colonoscopy, Care After °Refer to this sheet in the next few weeks. These instructions provide you with information on caring for yourself after your procedure. Your health care provider may also give you more specific instructions. Your treatment has been planned according to current medical practices, but problems sometimes occur. Call your health care provider if you have any problems or questions after your procedure. °WHAT TO EXPECT AFTER THE PROCEDURE  °After your procedure, it is typical to have the following: °· A small amount of blood in your stool. °· Moderate amounts of gas and mild abdominal cramping or bloating. °HOME CARE INSTRUCTIONS °· Do not drive, operate machinery, or sign important documents for 24 hours. °· You may shower and resume your regular physical activities, but move at a slower pace for the first 24 hours. °· Take frequent rest periods for the first 24 hours. °· Walk around or put a warm pack on your abdomen to help reduce abdominal cramping and bloating. °· Drink enough fluids to keep your urine clear or pale yellow. °· You may resume your normal diet as instructed by your health care provider. Avoid heavy or fried foods that are hard to digest. °· Avoid drinking alcohol for 24 hours or as instructed by your health care provider. °· Only take over-the-counter or prescription medicines as directed by your health care provider. °· If a tissue sample (biopsy) was taken during your procedure: °¨ Do not take aspirin or blood thinners for 7 days, or as instructed by your health care provider. °¨ Do not drink alcohol for 7 days, or as instructed by your health care provider. °¨ Eat soft foods for the first 24 hours. °SEEK MEDICAL CARE IF: °You have persistent spotting of blood in your stool 2-3 days after the procedure. °SEEK IMMEDIATE MEDICAL CARE IF: °· You have more than a small spotting of blood in your stool. °· You pass large blood clots in your stool. °· Your abdomen is swollen  (distended). °· You have nausea or vomiting. °· You have a fever. °· You have increasing abdominal pain that is not relieved with medicine. °  °This information is not intended to replace advice given to you by your health care provider. Make sure you discuss any questions you have with your health care provider. °  °Document Released: 01/28/2004 Document Revised: 04/05/2013 Document Reviewed: 02/20/2013 °Elsevier Interactive Patient Education ©2016 Elsevier Inc. ° °

## 2016-02-03 IMAGING — CT CT ABD-PELV W/ CM
1 of 2 series · 15 of 32 positions shown, 19 images · IV contrast (OMNIPAQUE 300)
Comparison: Prior examinations 04/03/2013 and 12/24/2013.

CLINICAL DATA: Low back pain with weakness and fever. Diagnosed
with kidney infection treated with antibiotics with limited
response.

EXAM:
CT ABDOMEN AND PELVIS WITH CONTRAST
TECHNIQUE: Multidetector CT imaging of the abdomen and pelvis was performed
using the standard protocol following bolus administration of
intravenous contrast.
CONTRAST:  50mL OMNIPAQUE IOHEXOL 300 MG/ML SOLN, 100mL OMNIPAQUE
IOHEXOL 300 MG/ML SOLN

[Series 2: abd/pel with · axial · 0.73mm/px · z∈[+909,+1319]mm · 15 of 90 slices shown, 19 images]
[im 4/90  soft-tissue]
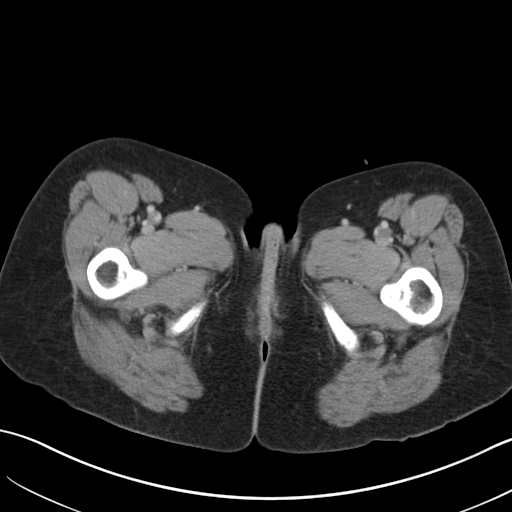
[im 4/90  bone]
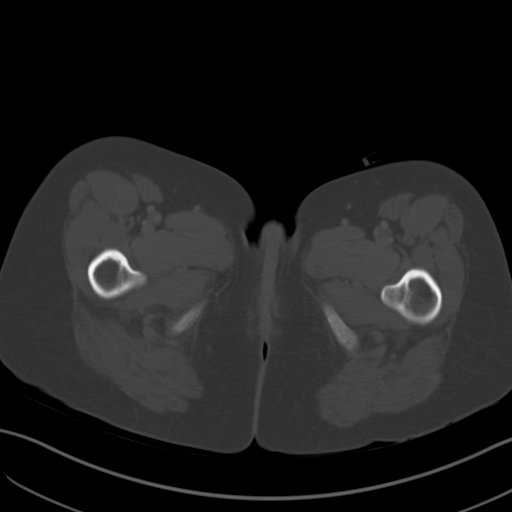
[im 11/90  soft-tissue]
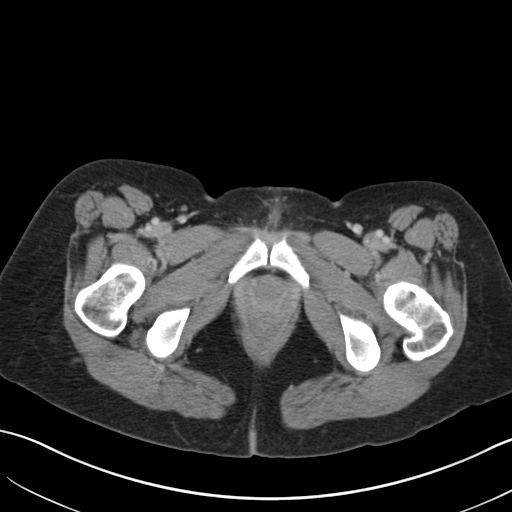
[im 18/90  soft-tissue]
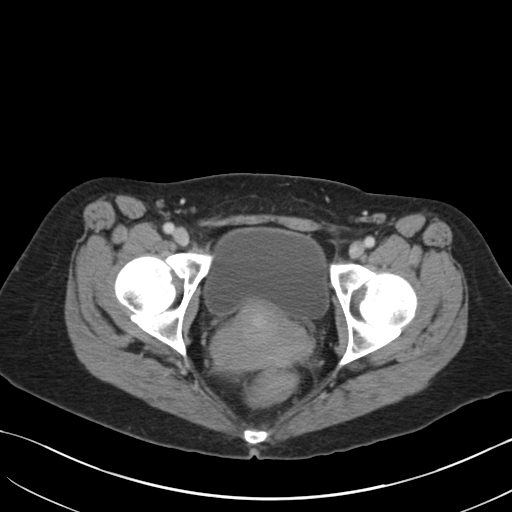
[im 25/90  soft-tissue]
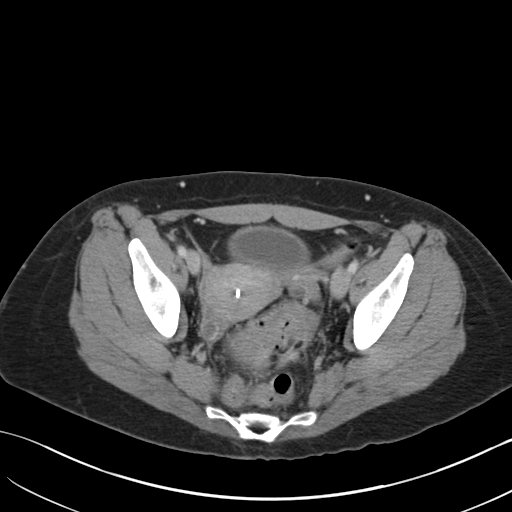
[im 33/90  soft-tissue]
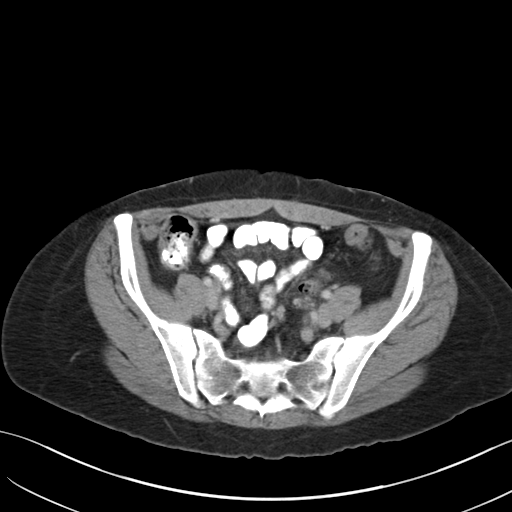
[im 40/90  soft-tissue]
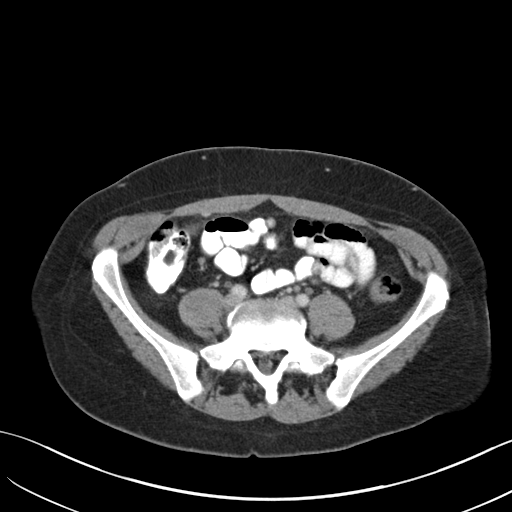
[im 47/90  soft-tissue]
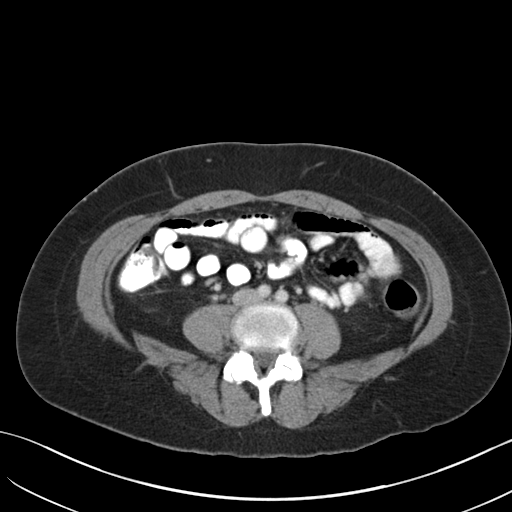
[im 50/90  soft-tissue]
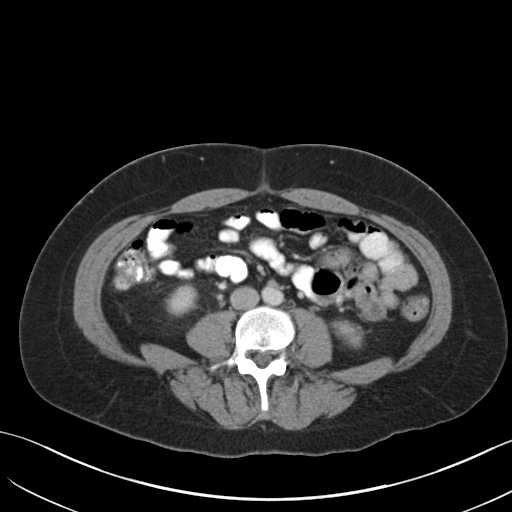
[im 57/90  soft-tissue]
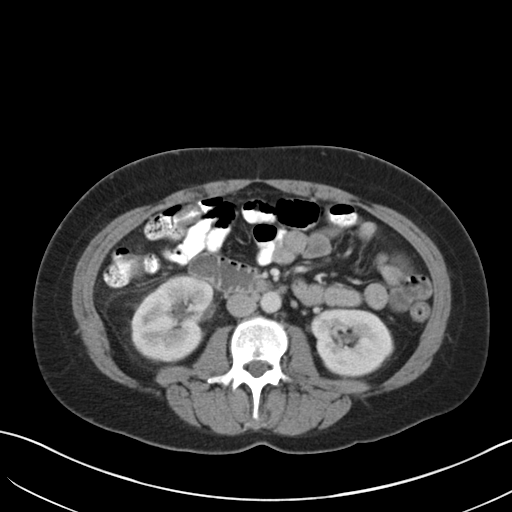
[im 57/90  bone]
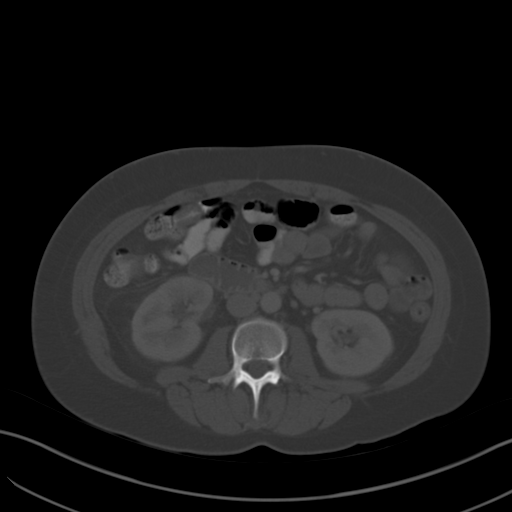
[im 65/90  soft-tissue]
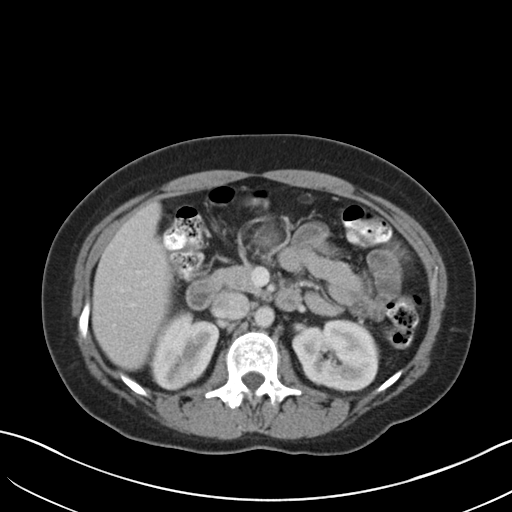
[im 72/90  soft-tissue]
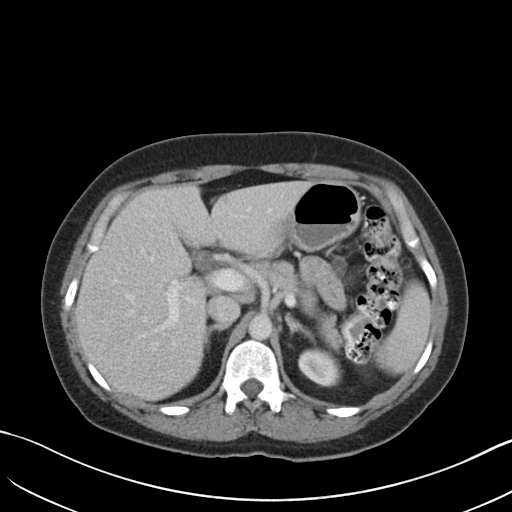
[im 75/90  lung]
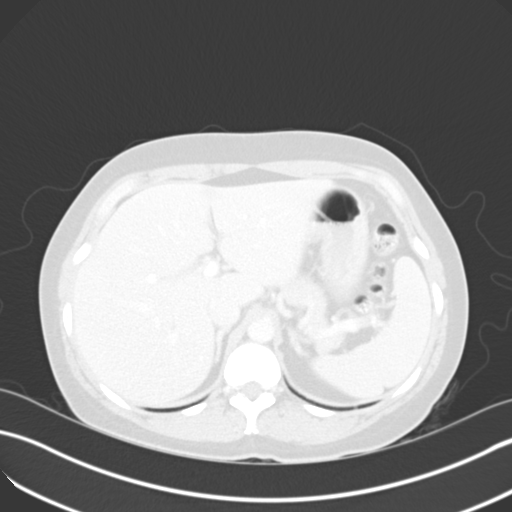
[im 79/90  soft-tissue]
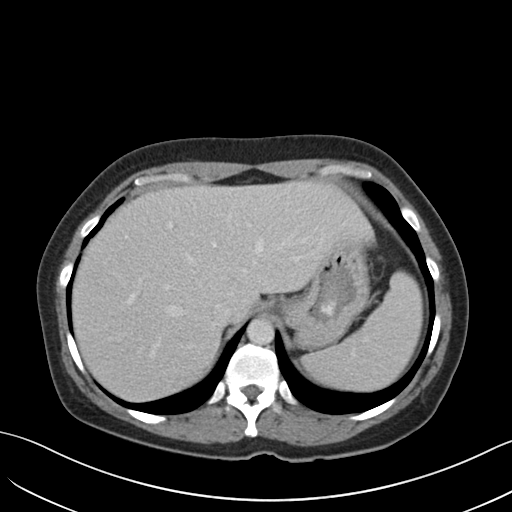
[im 79/90  lung]
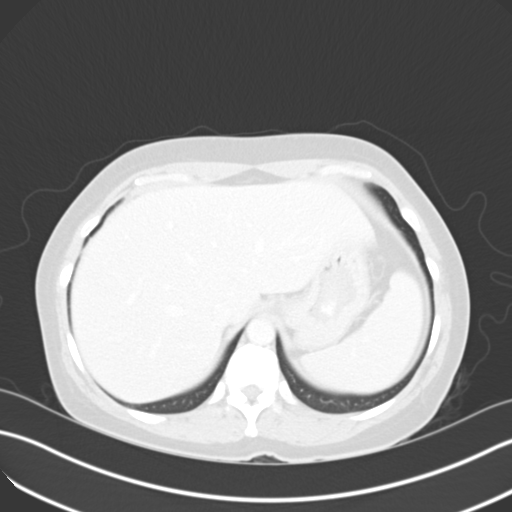
[im 82/90  lung]
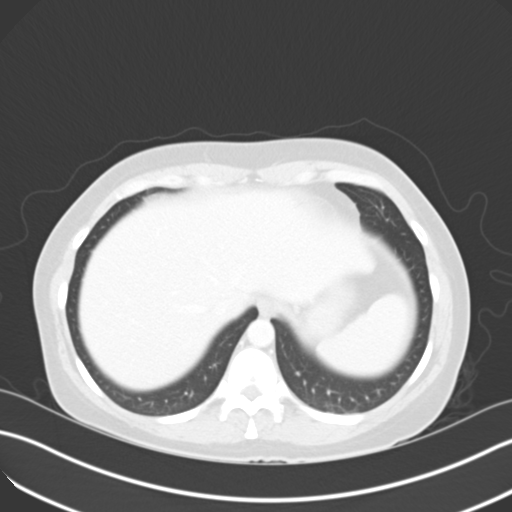
[im 86/90  soft-tissue]
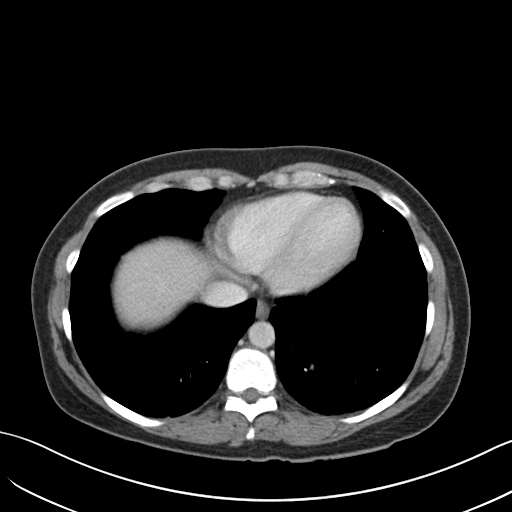
[im 86/90  lung]
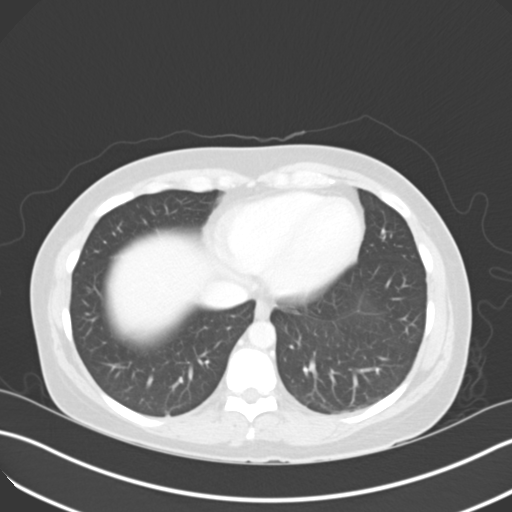

[15 of 32 positions shown; findings below may reference images not displayed]

FINDINGS: The lung bases are clear. There is no pleural or pericardial
effusion.

There has been interval improvement in the previously demonstrated
asymmetric perinephric soft tissue stranding and ill-defined fluid
on the right. Likewise, there is no longer any significant focal
abnormality within the right kidney. There is no hydronephrosis or
evidence of urinary tract calculus. The left kidney appears normal.
The bladder appears normal.

The liver, spleen, gallbladder, pancreas and adrenal glands appear
normal.

No significant vascular findings are demonstrated. Specifically, the
right renal vein appears normal. The left renal vein is retro
aortic.

The stomach, small bowel, appendix and proximal colon appear normal.
There is persistent diffuse sigmoid colon wall thickening with
suspicion of a small amount of extraluminal air and fluid lateral to
the sigmoid colon (images [id number]). The enteric contrast does not yet
passed through this region. No drainable fluid collection, bowel
obstruction or free intraperitoneal air is seen.

The uterus and ovaries appear stable. Intrauterine device is
present.

There are no worrisome osseous findings.
IMPRESSION: 1. Interval improvement in recently demonstrated changes of
right-sided pyelonephritis. No evidence of renal or perinephric
abscess. No evidence of ureteral obstruction.
2. Suspicion of recurrent sigmoid colon diverticulitis with small
amount of extraluminal air and fluid. No drainable abscess.
3. No evidence of bowel obstruction.

## 2018-07-25 ENCOUNTER — Encounter (HOSPITAL_COMMUNITY): Payer: Self-pay | Admitting: Emergency Medicine

## 2018-07-25 ENCOUNTER — Emergency Department (HOSPITAL_COMMUNITY): Payer: Medicaid Other

## 2018-07-25 ENCOUNTER — Emergency Department (HOSPITAL_COMMUNITY)
Admission: EM | Admit: 2018-07-25 | Discharge: 2018-07-25 | Disposition: A | Discharge status: Home / Self Care | Payer: Medicaid Other | Attending: Emergency Medicine | Admitting: Emergency Medicine

## 2018-07-25 DIAGNOSIS — F17210 Nicotine dependence, cigarettes, uncomplicated: Secondary | ICD-10-CM | POA: Insufficient documentation

## 2018-07-25 DIAGNOSIS — N10 Acute tubulo-interstitial nephritis: Secondary | ICD-10-CM | POA: Diagnosis not present

## 2018-07-25 DIAGNOSIS — R1032 Left lower quadrant pain: Secondary | ICD-10-CM | POA: Diagnosis present

## 2018-07-25 DIAGNOSIS — Z79899 Other long term (current) drug therapy: Secondary | ICD-10-CM | POA: Diagnosis not present

## 2018-07-25 DIAGNOSIS — I10 Essential (primary) hypertension: Secondary | ICD-10-CM | POA: Diagnosis not present

## 2018-07-25 DIAGNOSIS — N12 Tubulo-interstitial nephritis, not specified as acute or chronic: Secondary | ICD-10-CM

## 2018-07-25 LAB — PREGNANCY, URINE: PREG TEST UR: NEGATIVE

## 2018-07-25 LAB — COMPREHENSIVE METABOLIC PANEL
ALBUMIN: 3.8 g/dL (ref 3.5–5.0)
ALT: 14 U/L (ref 0–44)
ANION GAP: 11 (ref 5–15)
AST: 20 U/L (ref 15–41)
Alkaline Phosphatase: 91 U/L (ref 38–126)
BILIRUBIN TOTAL: 0.8 mg/dL (ref 0.3–1.2)
BUN: 9 mg/dL (ref 6–20)
CHLORIDE: 98 mmol/L (ref 98–111)
CO2: 24 mmol/L (ref 22–32)
Calcium: 8.8 mg/dL — ABNORMAL LOW (ref 8.9–10.3)
Creatinine, Ser: 0.77 mg/dL (ref 0.44–1.00)
GFR calc Af Amer: >60 mL/min (ref >60)
Glucose, Bld: 175 mg/dL — ABNORMAL HIGH (ref 70–99)
POTASSIUM: 3.4 mmol/L — AB (ref 3.5–5.1)
SODIUM: 133 mmol/L — AB (ref 135–145)
Total Protein: 7.9 g/dL (ref 6.5–8.1)

## 2018-07-25 LAB — CBC
HCT: 42.1 % (ref 36.0–46.0)
HEMOGLOBIN: 13.6 g/dL (ref 12.0–15.0)
MCH: 34.2 pg — ABNORMAL HIGH (ref 26.0–34.0)
MCHC: 32.3 g/dL (ref 30.0–36.0)
MCV: 105.8 fL — ABNORMAL HIGH (ref 80.0–100.0)
Platelets: 227 10*3/uL (ref 150–400)
RBC: 3.98 MIL/uL (ref 3.87–5.11)
RDW: 13.9 % (ref 11.5–15.5)
WBC: 10.7 10*3/uL — AB (ref 4.0–10.5)
nRBC: 0 % (ref 0.0–0.2)

## 2018-07-25 LAB — I-STAT BETA HCG BLOOD, ED (MC, WL, AP ONLY): I-stat hCG, quantitative: 6.8 m[IU]/mL — ABNORMAL HIGH (ref <5)

## 2018-07-25 LAB — RAPID URINE DRUG SCREEN, HOSP PERFORMED
AMPHETAMINES: NOT DETECTED
Barbiturates: NOT DETECTED
Benzodiazepines: POSITIVE — AB
Cocaine: NOT DETECTED
OPIATES: POSITIVE — AB
TETRAHYDROCANNABINOL: NOT DETECTED

## 2018-07-25 LAB — URINALYSIS, ROUTINE W REFLEX MICROSCOPIC
BILIRUBIN URINE: NEGATIVE
GLUCOSE, UA: NEGATIVE mg/dL
KETONES UR: 80 mg/dL — AB
NITRITE: POSITIVE — AB
PH: 5 (ref 5.0–8.0)
Protein, ur: 100 mg/dL — AB
Specific Gravity, Urine: 1.018 (ref 1.005–1.030)

## 2018-07-25 LAB — LIPASE, BLOOD: LIPASE: 31 U/L (ref 11–51)

## 2018-07-25 MED ORDER — HYDROCODONE-ACETAMINOPHEN 5-325 MG PO TABS: 1.0000 | ORAL_TABLET | Freq: Four times a day (QID) | ORAL | 0 refills | Status: DC | PRN

## 2018-07-25 MED ORDER — IBUPROFEN 600 MG PO TABS: 600.0000 mg | ORAL_TABLET | Freq: Four times a day (QID) | ORAL | 0 refills | Status: DC | PRN

## 2018-07-25 MED ORDER — CEPHALEXIN 500 MG PO CAPS: 1000.0000 mg | ORAL_CAPSULE | Freq: Two times a day (BID) | ORAL | 0 refills | Status: DC

## 2018-07-25 MED ORDER — ONDANSETRON 4 MG PO TBDP: 4.0000 mg | ORAL_TABLET | ORAL | 0 refills | Status: DC | PRN

## 2018-07-25 MED ORDER — SODIUM CHLORIDE 0.9 % IV BOLUS (SEPSIS)
1000.0000 mL | Freq: Once | INTRAVENOUS | Status: AC
Start: 2018-07-25 — End: 2018-07-25
  Administered 2018-07-25: 1000 mL via INTRAVENOUS

## 2018-07-25 MED ORDER — MORPHINE SULFATE (PF) 4 MG/ML IV SOLN
4.0000 mg | Freq: Once | INTRAVENOUS | Status: AC
  Administered 2018-07-25: 4 mg via INTRAVENOUS
  Filled 2018-07-25: qty 1

## 2018-07-25 MED ORDER — SODIUM CHLORIDE 0.9 % IV SOLN
1000.0000 mL | INTRAVENOUS | Status: DC
  Administered 2018-07-25: 1000 mL via INTRAVENOUS

## 2018-07-25 MED ORDER — SODIUM CHLORIDE (PF) 0.9 % IJ SOLN
INTRAMUSCULAR | Status: AC
  Filled 2018-07-25: qty 50

## 2018-07-25 MED ORDER — IOPAMIDOL (ISOVUE-300) INJECTION 61%
INTRAVENOUS | Status: AC
  Filled 2018-07-25: qty 100

## 2018-07-25 MED ORDER — KETOROLAC TROMETHAMINE 30 MG/ML IJ SOLN
30.0000 mg | Freq: Once | INTRAMUSCULAR | Status: AC
  Administered 2018-07-25: 30 mg via INTRAVENOUS
  Filled 2018-07-25: qty 1

## 2018-07-25 MED ORDER — IOPAMIDOL (ISOVUE-300) INJECTION 61%
100.0000 mL | Freq: Once | INTRAVENOUS | Status: AC | PRN
  Administered 2018-07-25: 100 mL via INTRAVENOUS

## 2018-07-25 MED ORDER — SODIUM CHLORIDE 0.9 % IV SOLN
1.0000 g | Freq: Once | INTRAVENOUS | Status: AC
  Administered 2018-07-25: 1 g via INTRAVENOUS
  Filled 2018-07-25: qty 10

## 2018-07-25 MED ORDER — ONDANSETRON HCL 4 MG/2ML IJ SOLN
4.0000 mg | Freq: Once | INTRAMUSCULAR | Status: AC
  Administered 2018-07-25: 4 mg via INTRAVENOUS
  Filled 2018-07-25: qty 2

## 2018-07-25 NOTE — ED Notes (Signed)
I stat HCG given to RN and MD. 

## 2018-07-25 NOTE — ED Triage Notes (Signed)
Pt c/o abd pains with n/v, fever and no BM for 10 days. Hx diverticulitis and had part of colon removed 4 years ago.

## 2018-07-25 NOTE — Discharge Instructions (Signed)
1.  Start taking Keflex tomorrow.  Take Zofran if needed for nausea.  Take ibuprofen for pain.  Take Vicodin if needed for additional pain control. 2.  Return to the emergency department if you are having increasing pain, nausea vomiting and cannot take your medications or worsening condition. 3.  Follow-up with your primary physician within the next 2 to 4 days.

## 2018-07-25 NOTE — ED Provider Notes (Signed)
[facility]-EMERGENCY DEPT Provider Note   CSN: [id number] Arrival date & time: 07/25/18  1030     History   Chief Complaint Chief Complaint  Patient presents with  . Abdominal Pain  . Fever  . Nausea    HPI Brandy Vaughn is a 43 y.o. female.  HPI Patient reports that she has a history of diverticulitis and partial bowel resection, this was done in October 2015 with diagnosis of diverticular stricture and colovaginal fistula.  Patient reports that she started having decreased bowel movements about 10 days ago.  She reports she started getting a lot of left lower quadrant pain.  She began to worry about possible obstruction so tried fiber pills and Dulcolax.  She reports she also tried an enema.  She reports she did have some bowel movement but it did not feel like it was enough.  She reports he then started to get concerned about another obstruction.  She reports she has had some fevers for the past 3 days.  She reports her maximum temperature is been 103.  She denies she has had vomiting.  She denies pain or burning with urination.  Patient does have history of chronic back pain.  She reports she used to be in pain management but no longer is.  She reports she does have some leftover hydrocodone that she tried for the abdominal pain.  She reports it was not effective. Past Medical History:  Diagnosis Date  . ADD (attention deficit disorder)   . Chronic back pain    on chronic narcotics, "Degenerative disc disease and buldging disc"  . Diverticulitis    2 months ago episode of abdominal pain -no xrays"put on antibiotics"-  . 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 Hypertension    no medication due to insurance in over 6 months- monitoring at [facility]- being doing ok.  . Neuromuscular disorder (HCC)    "sciatic nerve pain usually shooting down right leg"  . Panic attacks   . Rosacea    face and neck  . Vaginal tear  resulting from childbirth    3rd degree    Patient Active Problem List   Diagnosis Date Noted  . Vaginal tear resulting from childbirth 01/10/2014  . IUD (intrauterine device) in place 01/10/2014  . Lymphadenopathy, mesenteric 01/10/2014    Past Surgical History:  Procedure Laterality Date  . COLON SURGERY     2015 "bowel resection , hole in colon"  . COLONOSCOPY WITH PROPOFOL N/A 03/01/2014   Procedure: COLONOSCOPY WITH PROPOFOL;  Surgeon: Charolett Bumpers, MD;  Location: [facility];  Service: Endoscopy;  Laterality: N/A;  . COLONOSCOPY WITH PROPOFOL N/A 09/02/2015   Procedure: COLONOSCOPY WITH PROPOFOL;  Surgeon: Charolett Bumpers, MD;  Location: [facility];  Service: Endoscopy;  Laterality: N/A;  . WISDOM TOOTH EXTRACTION       OB History   No obstetric history on file.      Home Medications    Prior to Admission medications   Medication Sig Start Date End Date Taking? Authorizing Provider  acetaminophen (TYLENOL) 500 MG tablet Take 1,000 mg by mouth every 6 (six) hours as needed for mild pain, fever or headache.    Yes [provider]  bisacodyl (DULCOLAX) 5 MG EC tablet Take 5 mg by mouth daily as needed for moderate constipation.   Yes [provider]  etonogestrel (NEXPLANON) 68 MG IMPL implant 1 each  by Subdermal route once.   Yes [provider]  linaCLOtide (LINZESS PO) Take 1 tablet by mouth once.   Yes [provider]  Menthol, Topical Analgesic, (BIOFREEZE EX) Apply 1 application topically at bedtime as needed (back pain).   Yes [provider]  mineral oil enema Place 1 enema rectally once.   Yes [provider]  ondansetron (ZOFRAN) 4 MG tablet Take 4 mg by mouth every 8 (eight) hours as needed for nausea or vomiting.   Yes [provider]  ondansetron (ZOFRAN-ODT) 4 MG disintegrating tablet Take 4 mg by mouth every 8 (eight) hours as needed for nausea or vomiting.   Yes [provider]    polycarbophil (FIBERCON) 625 MG tablet Take 625 mg by mouth daily.   Yes [provider]  Probiotic Product (PROBIOTIC PO) Take 2 tablets by mouth daily.   Yes [provider]  cephALEXin (KEFLEX) 500 MG capsule Take 2 capsules (1,000 mg total) by mouth 2 (two) times daily. 07/25/18   Arby BarrettePfeiffer, Donita Newland, MD  HYDROcodone-acetaminophen (NORCO/VICODIN) 5-325 MG tablet Take 1-2 tablets by mouth every 6 (six) hours as needed for moderate pain or severe pain. 07/25/18   Arby BarrettePfeiffer, Johnte Portnoy, MD  ibuprofen (ADVIL,MOTRIN) 600 MG tablet Take 1 tablet (600 mg total) by mouth every 6 (six) hours as needed. 07/25/18   Arby BarrettePfeiffer, Antwione Picotte, MD  ondansetron (ZOFRAN ODT) 4 MG disintegrating tablet Take 1 tablet (4 mg total) by mouth every 4 (four) hours as needed for nausea or vomiting. 07/25/18   Arby BarrettePfeiffer, Rainn Zupko, MD    Family History Family History  Problem Relation Age of Onset  . Cancer Mother        Colon, Cervical   . Hypertension Father   . Osteoporosis Paternal Grandmother   . Hypertension Paternal Grandmother   . Cancer Paternal Grandfather        Bladder, Throat     Social History Social History   Tobacco Use  . Smoking status: Current Every Day Smoker    Packs/day: 0.50    Years: 0.00    Pack years: 0.00    Types: Cigarettes  . Smokeless tobacco: Never Used  . Tobacco comment: trying to cut back 5 cigs per day  Substance Use Topics  . Alcohol use: Yes    Comment: rare social  . Drug use: No     Allergies   Patient has no known allergies.   Review of Systems Review of Systems 10 Systems reviewed and are negative for acute change except as noted in the HPI.   Physical Exam Updated Vital Signs BP (!) 125/91   Pulse 95   Temp 99.3 F (37.4 C) (Oral)   Resp 18   Ht 5' 6.5" (1.689 m)   Wt 71 kg   SpO2 95%   BMI 24.89 kg/m   Physical Exam Constitutional:      Appearance: She is well-developed.     Comments: Patient is alert and nontoxic.  Clinically well in  appearance without respiratory distress.  HENT:     Head: Normocephalic and atraumatic.  Eyes:     Pupils: Pupils are equal, round, and reactive to light.  Neck:     Musculoskeletal: Neck supple.  Cardiovascular:     Rate and Rhythm: Normal rate and regular rhythm.     Heart sounds: Normal heart sounds.  Pulmonary:     Effort: Pulmonary effort is normal.     Breath sounds: Normal breath sounds.  Abdominal:  General: Bowel sounds are normal. There is no distension.     Palpations: Abdomen is soft.     Tenderness: There is abdominal tenderness.     Comments: Abdomen is soft without guarding.  Patient endorses pain to palpation left lower quadrant.  No appreciable mass.  Musculoskeletal: Normal range of motion.        General: No tenderness.  Skin:    General: Skin is warm and dry.  Neurological:     Mental Status: She is alert and oriented to person, place, and time.     GCS: GCS eye subscore is 4. GCS verbal subscore is 5. GCS motor subscore is 6.     Coordination: Coordination normal.      ED Treatments / Results  Labs (all labs ordered are listed, but only abnormal results are displayed) Labs Reviewed  COMPREHENSIVE METABOLIC PANEL - Abnormal; Notable for the following components:      Result Value   Sodium 133 (*)    Potassium 3.4 (*)    Glucose, Bld 175 (*)    Calcium 8.8 (*)    All other components within normal limits  CBC - Abnormal; Notable for the following components:   WBC 10.7 (*)    MCV 105.8 (*)    MCH 34.2 (*)    All other components within normal limits  URINALYSIS, ROUTINE W REFLEX MICROSCOPIC - Abnormal; Notable for the following components:   Color, Urine AMBER (*)    APPearance HAZY (*)    Hgb urine dipstick MODERATE (*)    Ketones, ur 80 (*)    Protein, ur 100 (*)    Nitrite POSITIVE (*)    Leukocytes, UA SMALL (*)    Bacteria, UA MANY (*)    All other components within normal limits  RAPID URINE DRUG SCREEN, HOSP PERFORMED - Abnormal;  Notable for the following components:   Opiates POSITIVE (*)    Benzodiazepines POSITIVE (*)    All other components within normal limits  I-STAT BETA HCG BLOOD, ED ([facility], [facility], [facility] ONLY) - Abnormal; Notable for the following components:   I-stat hCG, quantitative 6.8 (*)    All other components within normal limits  URINE CULTURE  LIPASE, BLOOD  PREGNANCY, URINE    EKG None  Radiology Ct Abdomen Pelvis W Contrast  Result Date: 07/25/2018 CLINICAL DATA:  Abdominal pain with nausea, vomiting, fever, and constipation. History of diverticulitis and partial colectomy. EXAM: CT ABDOMEN AND PELVIS WITH CONTRAST TECHNIQUE: Multidetector CT imaging of the abdomen and pelvis was performed using the standard protocol following bolus administration of intravenous contrast. CONTRAST:  100mL ISOVUE-300 IOPAMIDOL (ISOVUE-300) INJECTION 61% COMPARISON:  05/18/2014 FINDINGS: Lower chest: Clear lung bases. Hepatobiliary: No focal liver abnormality is seen. No gallstones, gallbladder wall thickening, or biliary dilatation. Pancreas: Unremarkable. Spleen: Unremarkable. Adrenals/Urinary Tract: Unremarkable adrenal glands. New bilateral perinephric stranding with ill-defined regions of hypoenhancement involving the left greater than right kidneys. Mild periureteral stranding and prominent urothelial enhancement more notable involving the left ureter. No hydronephrosis or urolithiasis. Unremarkable bladder. Stomach/Bowel: The stomach is decompressed. Postoperative changes are again noted from partial colectomy with an anastomosis in the rectosigmoid region. Distal colonic diverticulosis is noted without evidence of diverticulitis. There is no evidence of bowel obstruction. The appendix is unremarkable. Vascular/Lymphatic: Normal caliber of the abdominal aorta. Mildly prominent number of subcentimeter para-aortic/aortocaval lymph nodes, similar to the prior study. Reproductive: Uterus and bilateral adnexa are unremarkable.  Other: No intraperitoneal free fluid. No abdominal wall hernia. Musculoskeletal: No  acute osseous abnormality or suspicious osseous lesion. IMPRESSION: New inflammatory changes involving the left greater than right kidneys compatible with acute pyelonephritis. No hydronephrosis, stone, or well-defined abscess. Electronically Signed   By: Sebastian Ache M.D.   On: 07/25/2018 13:46    Procedures Procedures (including critical care time)  Medications Ordered in ED Medications  sodium chloride 0.9 % bolus 1,000 mL (0 mLs Intravenous Stopped 07/25/18 1333)    Followed by  0.9 %  sodium chloride infusion (1,000 mLs Intravenous New Bag/Given 07/25/18 1420)  sodium chloride (PF) 0.9 % injection (  Canceled Entry 07/25/18 1420)  morphine 4 MG/ML injection 4 mg (4 mg Intravenous Given 07/25/18 1214)  iopamidol (ISOVUE-300) 61 % injection 100 mL ( Intravenous Canceled Entry 07/25/18 1420)  cefTRIAXone (ROCEPHIN) 1 g in sodium chloride 0.9 % 100 mL IVPB (1 g Intravenous New Bag/Given 07/25/18 1421)  ketorolac (TORADOL) 30 MG/ML injection 30 mg (30 mg Intravenous Given 07/25/18 1421)  ondansetron (ZOFRAN) injection 4 mg (4 mg Intravenous Given 07/25/18 1421)     Initial Impression / Assessment and Plan / ED Course  I have reviewed the triage vital signs and the nursing notes.  Pertinent labs & imaging results that were available during my care of the patient were reviewed by me and considered in my medical decision making (see chart for details).    Patient is alert and nontoxic.  Mental status clear.  She has been having lower abdominal pain.  CT rules out bowel obstruction or colitis.  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  At this time patient is stable for outpatient treatment.  Return precautions reviewed.  Final Clinical Impressions(s) / ED Diagnoses   Final diagnoses:  Pyelonephritis    ED Discharge Orders         Ordered    cephALEXin (KEFLEX) 500 MG capsule  2 times daily,    Status:  Discontinued     07/25/18 1411    ondansetron (ZOFRAN ODT) 4 MG disintegrating tablet  Every 4 hours PRN,   Status:  Discontinued     07/25/18 1411    HYDROcodone-acetaminophen (NORCO/VICODIN) 5-325 MG tablet  Every 6 hours PRN,   Status:  Discontinued     07/25/18 1411    ibuprofen (ADVIL,MOTRIN) 600 MG tablet  Every 6 hours PRN,   Status:  Discontinued     07/25/18 1411    cephALEXin (KEFLEX) 500 MG capsule  2 times daily     07/25/18 1454    HYDROcodone-acetaminophen (NORCO/VICODIN) 5-325 MG tablet  Every 6 hours PRN     07/25/18 1454    ibuprofen (ADVIL,MOTRIN) 600 MG tablet  Every 6 hours PRN     07/25/18 1454    ondansetron (ZOFRAN ODT) 4 MG disintegrating tablet  Every 4 hours PRN     07/25/18 1454           Arby Barrette, MD 07/25/18 1459

## 2018-07-28 LAB — URINE CULTURE: Culture: >=100000 — AB

## 2018-07-29 ENCOUNTER — Telehealth: Payer: Self-pay | Admitting: *Deleted

## 2018-07-29 NOTE — Telephone Encounter (Signed)
Post ED Visit - Positive Culture Follow-up  Culture report reviewed by antimicrobial stewardship pharmacist:  []  Enzo Bi, Pharm.D. []  Celedonio Miyamoto, Pharm.D., BCPS AQ-ID []  Garvin Fila, Pharm.D., BCPS []  Georgina Pillion, [address].D., BCPS []  [place], [place].D., BCPS, AAHIVP []  Estella Husk, Pharm.D., BCPS, AAHIVP [x]  Lysle Pearl, PharmD, BCPS []  Phillips Climes, PharmD, BCPS []  Agapito Games, PharmD, BCPS []  Verlan Friends, PharmD  Positive urine culture Treated with cephalexin, organism sensitive to the same and no further patient follow-up is required at this time.  Virl Axe [facility] 07/29/2018, 12:04 PM

## 2020-03-22 ENCOUNTER — Other Ambulatory Visit: Payer: Self-pay

## 2020-03-22 ENCOUNTER — Emergency Department (HOSPITAL_COMMUNITY)
Admission: EM | Admit: 2020-03-22 | Discharge: 2020-03-22 | Disposition: A | Discharge status: Home / Self Care | Payer: Medicaid Other | Attending: Emergency Medicine | Admitting: Emergency Medicine

## 2020-03-22 ENCOUNTER — Emergency Department (HOSPITAL_COMMUNITY): Payer: Medicaid Other

## 2020-03-22 ENCOUNTER — Encounter (HOSPITAL_COMMUNITY): Payer: Self-pay

## 2020-03-22 DIAGNOSIS — Z79899 Other long term (current) drug therapy: Secondary | ICD-10-CM | POA: Insufficient documentation

## 2020-03-22 DIAGNOSIS — Z20822 Contact with and (suspected) exposure to covid-19: Secondary | ICD-10-CM | POA: Diagnosis not present

## 2020-03-22 DIAGNOSIS — F17210 Nicotine dependence, cigarettes, uncomplicated: Secondary | ICD-10-CM | POA: Insufficient documentation

## 2020-03-22 DIAGNOSIS — I10 Essential (primary) hypertension: Secondary | ICD-10-CM | POA: Diagnosis not present

## 2020-03-22 DIAGNOSIS — R1032 Left lower quadrant pain: Secondary | ICD-10-CM | POA: Diagnosis present

## 2020-03-22 LAB — CBC
HCT: 42.8 % (ref 36.0–46.0)
Hemoglobin: 14.9 g/dL (ref 12.0–15.0)
MCH: 33 pg (ref 26.0–34.0)
MCHC: 34.8 g/dL (ref 30.0–36.0)
MCV: 94.9 fL (ref 80.0–100.0)
Platelets: 353 10*3/uL (ref 150–400)
RBC: 4.51 MIL/uL (ref 3.87–5.11)
RDW: 11.9 % (ref 11.5–15.5)
WBC: 7.6 10*3/uL (ref 4.0–10.5)
nRBC: 0 % (ref 0.0–0.2)

## 2020-03-22 LAB — COMPREHENSIVE METABOLIC PANEL
ALT: 11 U/L (ref 0–44)
AST: 12 U/L — ABNORMAL LOW (ref 15–41)
Albumin: 4.3 g/dL (ref 3.5–5.0)
Alkaline Phosphatase: 79 U/L (ref 38–126)
Anion gap: 12 (ref 5–15)
BUN: 7 mg/dL (ref 6–20)
CO2: 25 mmol/L (ref 22–32)
Calcium: 9.5 mg/dL (ref 8.9–10.3)
Chloride: 103 mmol/L (ref 98–111)
Creatinine, Ser: 0.66 mg/dL (ref 0.44–1.00)
GFR calc Af Amer: >60 mL/min (ref >60)
GFR calc non Af Amer: >60 mL/min (ref >60)
Glucose, Bld: 98 mg/dL (ref 70–99)
Potassium: 3.4 mmol/L — ABNORMAL LOW (ref 3.5–5.1)
Sodium: 140 mmol/L (ref 135–145)
Total Bilirubin: 0.3 mg/dL (ref 0.3–1.2)
Total Protein: 7.8 g/dL (ref 6.5–8.1)

## 2020-03-22 LAB — I-STAT BETA HCG BLOOD, ED (MC, WL, AP ONLY): I-stat hCG, quantitative: <5 m[IU]/mL (ref <5)

## 2020-03-22 LAB — RESP PANEL BY RT PCR (RSV, FLU A&B, COVID)
Influenza A by PCR: NEGATIVE
Influenza B by PCR: NEGATIVE
Respiratory Syncytial Virus by PCR: NEGATIVE
SARS Coronavirus 2 by RT PCR: NEGATIVE

## 2020-03-22 LAB — LIPASE, BLOOD: Lipase: 47 U/L (ref 11–51)

## 2020-03-22 MED ORDER — ONDANSETRON HCL 4 MG PO TABS: 4.0000 mg | ORAL_TABLET | Freq: Three times a day (TID) | ORAL | 0 refills | Status: DC | PRN

## 2020-03-22 MED ORDER — DIPHENHYDRAMINE HCL 25 MG PO CAPS
25.0000 mg | ORAL_CAPSULE | Freq: Once | ORAL | Status: AC
  Administered 2020-03-22: 25 mg via ORAL
  Filled 2020-03-22: qty 1

## 2020-03-22 MED ORDER — ONDANSETRON 4 MG PO TBDP
4.0000 mg | ORAL_TABLET | Freq: Once | ORAL | Status: AC | PRN
  Administered 2020-03-22: 4 mg via ORAL
  Filled 2020-03-22: qty 1

## 2020-03-22 MED ORDER — ACETAMINOPHEN 325 MG PO TABS
650.0000 mg | ORAL_TABLET | Freq: Once | ORAL | Status: AC
  Administered 2020-03-22: 650 mg via ORAL
  Filled 2020-03-22: qty 2

## 2020-03-22 MED ORDER — SODIUM CHLORIDE 0.9 % IV BOLUS
1000.0000 mL | Freq: Once | INTRAVENOUS | Status: AC
  Administered 2020-03-22: 1000 mL via INTRAVENOUS

## 2020-03-22 MED ORDER — FAMOTIDINE 20 MG PO TABS: 20.0000 mg | ORAL_TABLET | Freq: Every day | ORAL | 0 refills | Status: AC

## 2020-03-22 MED ORDER — DICYCLOMINE HCL 20 MG PO TABS: 20.0000 mg | ORAL_TABLET | Freq: Two times a day (BID) | ORAL | 0 refills | Status: AC | PRN

## 2020-03-22 MED ORDER — IOHEXOL 300 MG/ML  SOLN
100.0000 mL | Freq: Once | INTRAMUSCULAR | Status: AC | PRN
  Administered 2020-03-22: 100 mL via INTRAVENOUS

## 2020-03-22 NOTE — Discharge Instructions (Addendum)
You have been seen here for abdominal pain.  Lab work and imaging all look reassuring.  I have prescribed you Bentyl please use as needed for abdominal pain.  I have also given you Zofran for your nausea please use as prescribed.  I want you to start at a liquid diet and advance as tolerating.  Your Covid test is pending I want to self quarantine until your results are back.  You can find this on my chart.  If you are Covid positive you must self quarantine for 10 days since symptom onset.  Also given contact information for post Covid care please contact them if positive.   I want you to follow-up with your GI doctor. I have also given contact information of a GI doctor on call please contact them at your earliest convenience for further evaluation.   come back to emergency department if you develop severe chest pain, shortness of breath, severe abdominal pain, uncontrolled nausea vomiting diarrhea i as these symptoms require further evaluation management.

## 2020-03-22 NOTE — ED Provider Notes (Signed)
Brandy Vaughn   CSN: [id number] Arrival date & time: 03/22/20  1240     History Chief Complaint  Patient presents with  . Abdominal Pain  . Nausea  . Urticaria    Brandy Vaughn is a 44 y.o. female.  HPI   Patient with significant medical history of diverticulitis, partial bowel resection done on October 15, colovaginal fistula presents to the emergency department with chief complaint of left lower abdominal pain x2 months.  Patient states she has had intermittent left lower abdominal pain which she describes as a crampy, sharp-like pain which stays in that area.  She states the pain comes on suddenly and will last generally 1 day.  She endorses moving or eating tends to make the pain worse.  She noticed for the last week she has had more loose stools, and stating it smells like diverticulitis.  She also mentions that she has lost 40 pounds in the last few months.  She denies bone pain, fatigue, but does endorse subjective fevers and chills.  She also mentioned she has a history of colorectal cancer in her last colonoscopy was 3 years ago.  Patient denies any alleviating factors at this time.  Patient denies headache, sore throat, cough, chest pain, shortness of breath, vomiting, diarrhea, dysuria, worsening pedal edema.  Past Medical History:  Diagnosis Date  . ADD (attention deficit disorder)   . Chronic back pain    on chronic narcotics, "Degenerative disc disease and buldging disc"  . Diverticulitis    2 months ago episode of abdominal pain -no xrays"put on antibiotics"-  . 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 Hypertension    no medication due to insurance in over 6 months- monitoring at [facility]- being doing ok.  . Neuromuscular disorder (HCC)    "sciatic nerve pain usually shooting down right leg"  . Panic attacks   . Rosacea    face and neck  . Vaginal tear resulting from  childbirth    3rd degree    Patient Active Problem List   Diagnosis Date Noted  . Vaginal tear resulting from childbirth 01/10/2014  . IUD (intrauterine device) in place 01/10/2014  . Lymphadenopathy, mesenteric 01/10/2014    Past Surgical History:  Procedure Laterality Date  . COLON SURGERY     2015 "bowel resection , hole in colon"  . COLONOSCOPY WITH PROPOFOL N/A 03/01/2014   Procedure: COLONOSCOPY WITH PROPOFOL;  Surgeon: Charolett BumpersMartin K Johnson, MD;  Location: [facility];  Service: Endoscopy;  Laterality: N/A;  . COLONOSCOPY WITH PROPOFOL N/A 09/02/2015   Procedure: COLONOSCOPY WITH PROPOFOL;  Surgeon: Charolett BumpersMartin K Johnson, MD;  Location: [facility];  Service: Endoscopy;  Laterality: N/A;  . WISDOM TOOTH EXTRACTION       OB History   No obstetric history on file.     Family History  Problem Relation Age of Onset  . Cancer Mother        Colon, Cervical   . Hypertension Father   . Osteoporosis Paternal Grandmother   . Hypertension Paternal Grandmother   . Cancer Paternal Grandfather        Bladder, Throat     Social History   Tobacco Use  . Smoking status: Current Every Day Smoker    Packs/day: 0.50    Years: 0.00    Pack years: 0.00    Types: Cigarettes  . Smokeless tobacco: Never Used  . Tobacco  comment: trying to cut back 5 cigs per day  Vaping Use  . Vaping Use: Never used  Substance Use Topics  . Alcohol use: Yes    Comment: rare social  . Drug use: No    Home Medications Prior to Admission medications   Medication Sig Start Date End Date Taking? Authorizing Provider  acetaminophen (TYLENOL) 500 MG tablet Take 1,000 mg by mouth every 6 (six) hours as needed for mild pain, fever or headache.    Yes [provider]  Bisacodyl (FLEET LAXATIVE PO) Take 1 Bottle by mouth as needed (constipation).   Yes [provider]  diphenhydrAMINE (BENADRYL) 25 mg capsule Take 25 mg by mouth 2 (two) times daily as needed for itching or allergies.   Yes  [provider]  etonogestrel (NEXPLANON) 68 MG IMPL implant 1 each by Subdermal route once.   Yes [provider]  Menthol, Topical Analgesic, (BIOFREEZE EX) Apply 1 application topically at bedtime as needed (back pain).   Yes [provider]  Probiotic Product (PROBIOTIC PO) Take 1 tablet by mouth daily.    Yes [provider]  cephALEXin (KEFLEX) 500 MG capsule Take 2 capsules (1,000 mg total) by mouth 2 (two) times daily. Patient not taking: Reported on 03/22/2020 07/25/18   Arby Barrette, MD  dicyclomine (BENTYL) 20 MG tablet Take 1 tablet (20 mg total) by mouth 2 (two) times daily as needed for spasms. 03/22/20   Carroll Sage, PA-C  famotidine (PEPCID) 20 MG tablet Take 1 tablet (20 mg total) by mouth daily. 03/22/20   Carroll Sage, PA-C  HYDROcodone-acetaminophen (NORCO/VICODIN) 5-325 MG tablet Take 1-2 tablets by mouth every 6 (six) hours as needed for moderate pain or severe pain. Patient not taking: Reported on 03/22/2020 07/25/18   Arby Barrette, MD  ibuprofen (ADVIL,MOTRIN) 600 MG tablet Take 1 tablet (600 mg total) by mouth every 6 (six) hours as needed. Patient not taking: Reported on 03/22/2020 07/25/18   Arby Barrette, MD  linaCLOtide (LINZESS PO) Take 1 tablet by mouth once. Patient not taking: Reported on 03/22/2020    [provider]  ondansetron (ZOFRAN ODT) 4 MG disintegrating tablet Take 1 tablet (4 mg total) by mouth every 4 (four) hours as needed for nausea or vomiting. Patient not taking: Reported on 03/22/2020 07/25/18   Arby Barrette, MD  ondansetron (ZOFRAN) 4 MG tablet Take 4 mg by mouth every 8 (eight) hours as needed for nausea or vomiting. Patient not taking: Reported on 03/22/2020    [provider]  ondansetron (ZOFRAN) 4 MG tablet Take 1 tablet (4 mg total) by mouth every 8 (eight) hours as needed for nausea or vomiting. 03/22/20   Carroll Sage, PA-C  ondansetron (ZOFRAN-ODT) 4 MG  disintegrating tablet Take 4 mg by mouth every 8 (eight) hours as needed for nausea or vomiting. Patient not taking: Reported on 03/22/2020    [provider]    Allergies    Patient has no known allergies.  Review of Systems   Review of Systems  Constitutional: Positive for chills and fever.  HENT: Negative for congestion, tinnitus and trouble swallowing.   Eyes: Negative for visual disturbance.  Respiratory: Negative for cough and shortness of breath.   Cardiovascular: Negative for chest pain and palpitations.  Gastrointestinal: Positive for abdominal pain, diarrhea and nausea. Negative for vomiting.  Genitourinary: Negative for enuresis, flank pain and frequency.  Musculoskeletal: Negative for back pain.  Skin: Negative for rash.  Neurological: Negative for dizziness  and headaches.  Hematological: Does not bruise/bleed easily.    Physical Exam Updated Vital Signs BP (!) 161/103   Pulse 74   Temp 97.8 F (36.6 C) (Oral)   Resp 18   Ht 5\' 7"  (1.702 m)   Wt 55.8 kg   SpO2 99%   BMI 19.26 kg/m   Physical Exam Vitals and nursing Vaughn reviewed.  Constitutional:      General: She is not in acute distress.    Appearance: She is not ill-appearing.  HENT:     Head: Normocephalic and atraumatic.     Nose: No congestion.     Mouth/Throat:     Mouth: Mucous membranes are moist.     Pharynx: Oropharynx is clear.  Eyes:     General: No scleral icterus. Cardiovascular:     Rate and Rhythm: Normal rate and regular rhythm.     Pulses: Normal pulses.     Heart sounds: No murmur heard.  No friction rub. No gallop.   Pulmonary:     Effort: No respiratory distress.     Breath sounds: No wheezing, rhonchi or rales.  Abdominal:     General: There is no distension.     Tenderness: There is abdominal tenderness. There is no right CVA tenderness, left CVA tenderness or guarding.     Comments: Patient abdomen was visualized, nondistended, normoactive bowel sounds, dull to  percussion, tenderness to palpation in the left lower quadrant, no peritoneal sign noted.   Musculoskeletal:        General: No swelling.     Right lower leg: No edema.     Left lower leg: No edema.  Skin:    General: Skin is warm and dry.     Findings: No rash.  Neurological:     Mental Status: She is alert.  Psychiatric:        Mood and Affect: Mood normal.     ED Results / Procedures / Treatments   Labs (all labs ordered are listed, but only abnormal results are displayed) Labs Reviewed  COMPREHENSIVE METABOLIC PANEL - Abnormal; Notable for the following components:      Result Value   Potassium 3.4 (*)    AST 12 (*)    All other components within normal limits  RESP PANEL BY RT PCR (RSV, FLU A&B, COVID)  LIPASE, BLOOD  CBC  URINALYSIS, ROUTINE W REFLEX MICROSCOPIC  I-STAT BETA HCG BLOOD, ED ([facility], [facility], [facility] ONLY)    EKG None  Radiology CT Abdomen Pelvis W Contrast  Result Date: 03/22/2020 CLINICAL DATA:  Left lower quadrant abdominal pain and nausea for 2 weeks EXAM: CT ABDOMEN AND PELVIS WITH CONTRAST TECHNIQUE: Multidetector CT imaging of the abdomen and pelvis was performed using the standard protocol following bolus administration of intravenous contrast. CONTRAST:  03/24/2020 OMNIPAQUE IOHEXOL 300 MG/ML  SOLN COMPARISON:  07/25/2018 FINDINGS: Lower chest: No acute pleural or parenchymal lung disease. Hepatobiliary: No focal liver abnormality is seen. No gallstones, gallbladder wall thickening, or biliary dilatation. Pancreas: Unremarkable. No pancreatic ductal dilatation or surrounding inflammatory changes. Spleen: Normal in size without focal abnormality. Adrenals/Urinary Tract: Adrenal glands are unremarkable. Kidneys are normal, without renal calculi, focal lesion, or hydronephrosis. Bladder is unremarkable. Stomach/Bowel: No bowel obstruction or ileus. Normal appendix right lower quadrant. Distal colon resection and reanastomosis. No bowel wall thickening or inflammatory  change. Vascular/Lymphatic: No significant vascular findings are present. No enlarged abdominal or pelvic lymph nodes. Reproductive: Uterus and bilateral adnexa are unremarkable. Other: No free  fluid or free gas. No abdominal wall hernia. Musculoskeletal: No acute or destructive bony lesions. Reconstructed images demonstrate no additional findings. IMPRESSION: No acute intra-abdominal or intrapelvic process. Electronically Signed   By: Sharlet Salina M.D.   On: 03/22/2020 18:22    Procedures Procedures (including critical care time)  Medications Ordered in ED Medications  ondansetron (ZOFRAN-ODT) disintegrating tablet 4 mg (4 mg Oral Given 03/22/20 1258)  sodium chloride 0.9 % bolus 1,000 mL (0 mLs Intravenous Stopped 03/22/20 2003)  iohexol (OMNIPAQUE) 300 MG/ML solution 100 mL (100 mLs Intravenous Contrast Given 03/22/20 1731)  acetaminophen (TYLENOL) tablet 650 mg (650 mg Oral Given 03/22/20 1819)  diphenhydrAMINE (BENADRYL) capsule 25 mg (25 mg Oral Given 03/22/20 1819)    ED Course  I have reviewed the triage vital signs and the nursing notes.  Pertinent labs & imaging results that were available during my care of the patient were reviewed by me and considered in my medical decision making (see chart for details).    MDM Rules/Calculators/A&P                          Patient presents emergency department with chief complaint of left lower abdominal pain x2 months.  Patient is alert, vital signs reassuring, patient does not appear to be in acute distress.  Will order screening labs, and imaging for further evaluation.  I-STAT hCG was less than 5, lipase 47, CBC does not show leukocytosis or signs anemia, CMP shows slight hypokalemia of 3.4, no metabolic acidosis, no signs of AKI, no anion gap noted.  Patient's respiratory panel negative for Covid, influenza A/B, RSV.  CT scan does not show any acute abnormalities.  I have low suspicion patient suffering from intra-abdominal abnormality  requiring surgical intervention as patient denies abdominal pain, tolerating p.o., no acute abdomen on exam, CT scan does not show any acute findings.  Low suspicion for UTI or pyelonephritis as patient denies urinary symptoms, no CVA tenderness noted on exam.  Low suspicion for malignancy as patient denies night sweats, bone pain, no signs of malignancy seen on CT scan.  Low suspicion for pancreatitis as patient do not have epigastric pain, lipase was 47.  Low suspicion for systemic infection as patient is nontoxic-appearing, vital signs reassuring, no leukocytosis noted CBC.  Low suspicion patient require hospitalization if Covid positive as she had no new oxygen requirements, no signs respiratory distress.  I suspect patient's abdominal pain may be secondary to adhesions from abdominal surgery.  Will recommend Bentyl, soft diet, follow-up with GI for further evaluation.  Patient's Covid test as well as respiratory panel negative   Vital signs remained stable, no indication for hospital admission.  Patient discussed with attending who agrees assessment plan.  Patient was given at home care as well strict return precautions.  Patient verbalized understood and agreed to plan.    Final Clinical Impression(s) / ED Diagnoses Final diagnoses:  Left lower quadrant abdominal pain    Rx / DC Orders ED Discharge Orders         Ordered    dicyclomine (BENTYL) 20 MG tablet  2 times daily PRN        03/22/20 1842    ondansetron (ZOFRAN) 4 MG tablet  Every 8 hours PRN        03/22/20 1842    famotidine (PEPCID) 20 MG tablet  Daily        03/22/20 1847  Carroll Sage, PA-C 03/22/20 2217    Charlynne Pander, MD 03/22/20 [phone]

## 2020-03-22 NOTE — ED Triage Notes (Addendum)
Per EMS-Patient c/o LLQ abdominal pain and nausea x 2 weeks.  Patient added in triage that she has been having hives and dizziness x 2 weeks.

## 2021-03-03 ENCOUNTER — Emergency Department (HOSPITAL_COMMUNITY)
Admission: EM | Admit: 2021-03-03 | Discharge: 2021-03-03 | Disposition: A | Discharge status: Home / Self Care | Payer: Medicaid Other | Attending: Emergency Medicine | Admitting: Emergency Medicine

## 2021-03-03 ENCOUNTER — Emergency Department (HOSPITAL_COMMUNITY): Payer: Medicaid Other

## 2021-03-03 ENCOUNTER — Other Ambulatory Visit: Payer: Self-pay

## 2021-03-03 DIAGNOSIS — Y901 Blood alcohol level of 20-39 mg/100 ml: Secondary | ICD-10-CM | POA: Insufficient documentation

## 2021-03-03 DIAGNOSIS — K5792 Diverticulitis of intestine, part unspecified, without perforation or abscess without bleeding: Secondary | ICD-10-CM | POA: Insufficient documentation

## 2021-03-03 DIAGNOSIS — Z79899 Other long term (current) drug therapy: Secondary | ICD-10-CM | POA: Diagnosis not present

## 2021-03-03 DIAGNOSIS — F10129 Alcohol abuse with intoxication, unspecified: Secondary | ICD-10-CM | POA: Insufficient documentation

## 2021-03-03 DIAGNOSIS — R4182 Altered mental status, unspecified: Secondary | ICD-10-CM | POA: Insufficient documentation

## 2021-03-03 DIAGNOSIS — Z20822 Contact with and (suspected) exposure to covid-19: Secondary | ICD-10-CM | POA: Diagnosis not present

## 2021-03-03 DIAGNOSIS — I10 Essential (primary) hypertension: Secondary | ICD-10-CM | POA: Diagnosis not present

## 2021-03-03 DIAGNOSIS — F17210 Nicotine dependence, cigarettes, uncomplicated: Secondary | ICD-10-CM | POA: Diagnosis not present

## 2021-03-03 LAB — RAPID URINE DRUG SCREEN, HOSP PERFORMED
Amphetamines: NOT DETECTED
Barbiturates: NOT DETECTED
Benzodiazepines: NOT DETECTED
Cocaine: NOT DETECTED
Opiates: NOT DETECTED
Tetrahydrocannabinol: NOT DETECTED

## 2021-03-03 LAB — COMPREHENSIVE METABOLIC PANEL
ALT: 27 U/L (ref 0–44)
AST: 28 U/L (ref 15–41)
Albumin: 4.2 g/dL (ref 3.5–5.0)
Alkaline Phosphatase: 57 U/L (ref 38–126)
Anion gap: 8 (ref 5–15)
BUN: 10 mg/dL (ref 6–20)
CO2: 24 mmol/L (ref 22–32)
Calcium: 8.8 mg/dL — ABNORMAL LOW (ref 8.9–10.3)
Chloride: 107 mmol/L (ref 98–111)
Creatinine, Ser: 0.51 mg/dL (ref 0.44–1.00)
GFR, Estimated: >60 mL/min (ref >60)
Glucose, Bld: 121 mg/dL — ABNORMAL HIGH (ref 70–99)
Potassium: 4 mmol/L (ref 3.5–5.1)
Sodium: 139 mmol/L (ref 135–145)
Total Bilirubin: 0.5 mg/dL (ref 0.3–1.2)
Total Protein: 7.4 g/dL (ref 6.5–8.1)

## 2021-03-03 LAB — RESP PANEL BY RT-PCR (FLU A&B, COVID) ARPGX2
Influenza A by PCR: NEGATIVE
Influenza B by PCR: NEGATIVE
SARS Coronavirus 2 by RT PCR: NEGATIVE

## 2021-03-03 LAB — ACETAMINOPHEN LEVEL: Acetaminophen (Tylenol), Serum: <10 ug/mL — ABNORMAL LOW (ref 10–30)

## 2021-03-03 LAB — SALICYLATE LEVEL: Salicylate Lvl: <7 mg/dL — ABNORMAL LOW (ref 7.0–30.0)

## 2021-03-03 LAB — LIPASE, BLOOD: Lipase: 30 U/L (ref 11–51)

## 2021-03-03 LAB — HCG, SERUM, QUALITATIVE: Preg, Serum: NEGATIVE

## 2021-03-03 LAB — ETHANOL: Alcohol, Ethyl (B): 23 mg/dL — ABNORMAL HIGH (ref <10)

## 2021-03-03 MED ORDER — ONDANSETRON HCL 4 MG/2ML IJ SOLN
4.0000 mg | Freq: Once | INTRAMUSCULAR | Status: AC
  Administered 2021-03-03: 4 mg via INTRAVENOUS
  Filled 2021-03-03: qty 2

## 2021-03-03 MED ORDER — IOHEXOL 350 MG/ML SOLN
80.0000 mL | Freq: Once | INTRAVENOUS | Status: AC | PRN
  Administered 2021-03-03: 80 mL via INTRAVENOUS

## 2021-03-03 MED ORDER — FOLIC ACID 1 MG PO TABS
1.0000 mg | ORAL_TABLET | Freq: Once | ORAL | Status: AC
  Administered 2021-03-03: 1 mg via ORAL
  Filled 2021-03-03: qty 1

## 2021-03-03 MED ORDER — ONDANSETRON 4 MG PO TBDP: 4.0000 mg | ORAL_TABLET | ORAL | 0 refills | Status: AC | PRN

## 2021-03-03 MED ORDER — HALOPERIDOL LACTATE 5 MG/ML IJ SOLN
2.0000 mg | Freq: Once | INTRAMUSCULAR | Status: AC
  Administered 2021-03-03: 2 mg via INTRAMUSCULAR
  Filled 2021-03-03: qty 1

## 2021-03-03 MED ORDER — SODIUM CHLORIDE 0.9 % IV BOLUS
1000.0000 mL | Freq: Once | INTRAVENOUS | Status: AC
  Administered 2021-03-03: 1000 mL via INTRAVENOUS

## 2021-03-03 MED ORDER — THIAMINE HCL 100 MG PO TABS
100.0000 mg | ORAL_TABLET | Freq: Once | ORAL | Status: AC
  Administered 2021-03-03: 100 mg via ORAL
  Filled 2021-03-03: qty 1

## 2021-03-03 MED ORDER — DIPHENHYDRAMINE HCL 50 MG/ML IJ SOLN
50.0000 mg | Freq: Once | INTRAMUSCULAR | Status: AC
  Administered 2021-03-03: 50 mg via INTRAMUSCULAR
  Filled 2021-03-03: qty 1

## 2021-03-03 NOTE — ED Notes (Signed)
Several attempts to place new IV were impeded d/t patient being combative.

## 2021-03-03 NOTE — ED Provider Notes (Signed)
Care of the patient assumed at the change of shift. Here for AMS, concern for EtOH intoxication.  Physical Exam  BP (!) 156/91   Pulse 78   Temp 98.2 F (36.8 C) (Oral)   Resp 19   SpO2 96%   Physical Exam Awake and alert Pupils equal No distress ED Course/Procedures     Procedures  MDM  Patient with negative ED workup, denies significant EtOH use, no drug use. No longer appears intoxicated, would like to have something to eat and to go home. Rx for Zofran as needed. RTED for any other concerns. Advised to avoid alcohol or drug use in the future.       Pollyann Savoy, MD 03/03/21 [phone]

## 2021-03-03 NOTE — ED Triage Notes (Signed)
Patient [facility].  Husband called for ETOH and AMS. Found 8 quarts empty etoh bottles under bed.  Patient is combative, with GCS 11 per EMS

## 2021-03-03 NOTE — ED Provider Notes (Signed)
[facility]GENCY [facility]PT Provider Note   CSN: [id number] Arrival date & time: 03/03/21  1015     History Chief Complaint  Patient presents with   Altered Mental Status   Alcohol Intoxication    Brandy Vaughn is a 45 y.o. female.  Pt is a 45 yo female presenting for AMS. As per EMS, pt has hx of alcohol abuse, found with 8 quarts of empty alcohol bottles under bed. On exam patient only responds verbally to state "don't touch me leave me alone". Unable to state name, location, or date. Unable to answer yes or no to any prompted questions. Moving all four extremities without difficulty. No external signs of trauma.   The history is provided by the patient and the EMS personnel. No language interpreter was used.  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   Neuromuscular disorder (HCC)    "sciatic nerve pain usually shooting down right leg"   Panic attacks    Rosacea    face and neck   Vaginal tear resulting from childbirth    3rd degree    Patient Active Problem List   Diagnosis Date Noted   Vaginal tear resulting from childbirth 01/10/2014   IUD (intrauterine device) in place 01/10/2014   Lymphadenopathy, mesenteric 01/10/2014    Past Surgical History:  Procedure Laterality Date   COLON SURGERY     2015 "bowel resection , hole in colon"   COLONOSCOPY WITH PROPOFOL N/A 03/01/2014   Procedure: COLONOSCOPY WITH PROPOFOL;  Surgeon: Charolett Bumpers, MD;  Location: [facility];  Service: Endoscopy;   Laterality: N/A;   COLONOSCOPY WITH PROPOFOL N/A 09/02/2015   Procedure: COLONOSCOPY WITH PROPOFOL;  Surgeon: Charolett Bumpers, MD;  Location: [facility];  Service: Endoscopy;  Laterality: N/A;   WISDOM TOOTH EXTRACTION       OB History   No obstetric history on file.     Family History  Problem Relation Age of Onset   Cancer Mother        Colon, Cervical    Hypertension Father    Osteoporosis Paternal Grandmother    Hypertension Paternal Grandmother    Cancer Paternal Grandfather        Bladder, Throat     Social History   Tobacco Use   Smoking status: Every Day    Packs/day: 0.50    Years: 0.00    Pack years: 0.00    Types: Cigarettes   Smokeless tobacco: Never   Tobacco comments:    trying to cut back 5 cigs per day  Vaping Use   Vaping Use: Never used  Substance Use Topics   Alcohol use: Yes    Comment: rare social   Drug use: No    Home Medications Prior to Admission medications   Medication Sig Start Date End Date Taking? Authorizing Provider  acetaminophen (TYLENOL) 500 MG tablet Take 1,000 mg by mouth every 6 (six) hours as needed for mild pain, fever or  headache.     [provider]  Bisacodyl (FLEET LAXATIVE PO) Take 1 Bottle by mouth as needed (constipation).    [provider]  cephALEXin (KEFLEX) 500 MG capsule Take 2 capsules (1,000 mg total) by mouth 2 (two) times daily. Patient not taking: Reported on 03/22/2020 07/25/18   Arby Barrette, MD  dicyclomine (BENTYL) 20 MG tablet Take 1 tablet (20 mg total) by mouth 2 (two) times daily as needed for spasms. 03/22/20   Carroll Sage, PA-C  diphenhydrAMINE (BENADRYL) 25 mg capsule Take 25 mg by mouth 2 (two) times daily as needed for itching or allergies.    [provider]  etonogestrel (NEXPLANON) 68 MG IMPL implant 1 each by Subdermal route once.    [provider]  famotidine (PEPCID) 20 MG tablet Take 1 tablet (20 mg total) by mouth daily. 03/22/20   Carroll Sage, PA-C  HYDROcodone-acetaminophen (NORCO/VICODIN) 5-325 MG tablet Take 1-2 tablets by mouth every 6 (six) hours as needed for moderate pain or severe pain. Patient not taking: Reported on 03/22/2020 07/25/18   Arby Barrette, MD  ibuprofen (ADVIL,MOTRIN) 600 MG tablet Take 1 tablet (600 mg total) by mouth every 6 (six) hours as needed. Patient not taking: Reported on 03/22/2020 07/25/18   Arby Barrette, MD  linaCLOtide (LINZESS PO) Take 1 tablet by mouth once. Patient not taking: Reported on 03/22/2020    [provider]  Menthol, Topical Analgesic, (BIOFREEZE EX) Apply 1 application topically at bedtime as needed (back pain).    [provider]  ondansetron (ZOFRAN ODT) 4 MG disintegrating tablet Take 1 tablet (4 mg total) by mouth every 4 (four) hours as needed for nausea or vomiting. Patient not taking: Reported on 03/22/2020 07/25/18   Arby Barrette, MD  ondansetron (ZOFRAN) 4 MG tablet Take 4 mg by mouth every 8 (eight) hours as needed for nausea or vomiting. Patient not taking: Reported on 03/22/2020    [provider]  ondansetron (ZOFRAN) 4 MG tablet Take 1 tablet (4 mg total) by mouth every 8 (eight) hours as needed for nausea or vomiting. 03/22/20   Carroll Sage, PA-C  ondansetron (ZOFRAN-ODT) 4 MG disintegrating tablet Take 4 mg by mouth every 8 (eight) hours as needed for nausea or vomiting. Patient not taking: Reported on 03/22/2020    [provider]  Probiotic Product (PROBIOTIC PO) Take 1 tablet by mouth daily.     [provider]    Allergies    Patient has no known allergies.  Review of Systems   Review of Systems  Unable to perform ROS: Mental status change   Physical Exam Updated Vital Signs BP (!) 186/114   Pulse 85   Temp 98.2 F (36.8 C) (Oral)   Resp 16   SpO2 97%   Physical Exam Vitals and nursing note reviewed.  Constitutional:      General: She is not in acute distress.    Appearance: She is  well-developed.  HENT:     Head: Normocephalic and atraumatic.  Eyes:     Conjunctiva/sclera: Conjunctivae normal.  Cardiovascular:     Rate and Rhythm: Normal rate and regular rhythm.     Heart sounds: No murmur heard. Pulmonary:     Effort: Pulmonary effort is normal. No respiratory distress.     Breath sounds: Normal breath sounds.  Abdominal:     Palpations: Abdomen is soft.     Tenderness: There is no abdominal tenderness.  Musculoskeletal:  Cervical back: Neck supple.  Skin:    General: Skin is warm and dry.  Neurological:     Mental Status: She is alert.     GCS: GCS eye subscore is 2. GCS verbal subscore is 4. GCS motor subscore is 5.     Comments: Moving all four extremities spontaneously Does not follow commands    Psychiatric:        Speech: Speech is slurred.        Behavior: Behavior is agitated and aggressive.    ED Results / Procedures / Treatments   Labs (all labs ordered are listed, but only abnormal results are displayed) Labs Reviewed  RESP PANEL BY RT-PCR (FLU A&B, COVID) [id number]  COMPREHENSIVE METABOLIC PANEL  ETHANOL  LIPASE, BLOOD  HCG, SERUM, QUALITATIVE  ACETAMINOPHEN LEVEL  RAPID URINE DRUG SCREEN, HOSP PERFORMED  SALICYLATE LEVEL    EKG None  Radiology No results found.  Procedures Procedures   Medications Ordered in ED Medications  sodium chloride 0.9 % bolus 1,000 mL (has no administration in time range)  ondansetron (ZOFRAN) injection 4 mg (has no administration in time range)    ED Course  I have reviewed the triage vital signs and the nursing notes.  Pertinent labs & imaging results that were available during my care of the patient were reviewed by me and considered in my medical decision making (see chart for details).    MDM Rules/Calculators/A&P                         11:06 AM 45 yo female presenting for AMS. As per EMS, pt has hx of alcohol abuse, found with 8 quarts of empty alcohol bottles under bed.   On  exam patient only responds verbally to state "don't touch me leave me alone". Unable to state name, location, or date. Unable to answer yes or no to any prompted questions. Moving all four extremities without difficulty. No external signs of trauma.    AMS: -Likely due to alcohol intoxication, unknown hx of trauma. Ethanol level 23. Folic acid and thiamine given. -CT head pending -No hypoxia -No anemia -No signs/symptoms of sepsis -Salicylate and acetaminophen normal -UDS negative   Pt signed out to on coming provider and stable for discharge after clinically sober.       Final Clinical Impression(s) / ED Diagnoses Final diagnoses:  Altered mental status, unspecified altered mental status type  Acute alcoholic intoxication without complication [facility])    Rx / DC Orders ED Discharge Orders     None        Franne Forts, DO 03/04/21 1622
